# Patient Record
Sex: Female | Born: 1985 | Race: Black or African American | Hispanic: No | Marital: Single | Smoking: Current some day smoker
Health system: Southern US, Community
[De-identification: ages and names within clinical notes are randomized; demographics above are authoritative.]

## PROBLEM LIST (undated history)

## (undated) ENCOUNTER — Emergency Department (HOSPITAL_COMMUNITY): Admission: EM | Payer: Self-pay | Source: Home / Self Care

## (undated) DIAGNOSIS — F32A Depression, unspecified: Secondary | ICD-10-CM

## (undated) DIAGNOSIS — N390 Urinary tract infection, site not specified: Secondary | ICD-10-CM

## (undated) DIAGNOSIS — Z9889 Other specified postprocedural states: Secondary | ICD-10-CM

## (undated) DIAGNOSIS — A599 Trichomoniasis, unspecified: Secondary | ICD-10-CM

## (undated) DIAGNOSIS — A749 Chlamydial infection, unspecified: Secondary | ICD-10-CM

## (undated) DIAGNOSIS — R112 Nausea with vomiting, unspecified: Secondary | ICD-10-CM

## (undated) DIAGNOSIS — F329 Major depressive disorder, single episode, unspecified: Secondary | ICD-10-CM

## (undated) HISTORY — DX: Other specified postprocedural states: Z98.890

## (undated) HISTORY — DX: Other specified postprocedural states: R11.2

## (undated) HISTORY — DX: Major depressive disorder, single episode, unspecified: F32.9

## (undated) HISTORY — DX: Depression, unspecified: F32.A

---
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
## 2008-06-13 ENCOUNTER — Emergency Department (HOSPITAL_COMMUNITY): Admission: EM | Admit: 2008-06-13 | Discharge: 2008-06-13 | Payer: Self-pay | Admitting: Emergency Medicine

## 2008-06-13 ENCOUNTER — Inpatient Hospital Stay (HOSPITAL_COMMUNITY): Admission: AD | Admit: 2008-06-13 | Discharge: 2008-06-13 | Payer: Self-pay | Admitting: Obstetrics & Gynecology

## 2008-06-13 ENCOUNTER — Ambulatory Visit: Payer: Self-pay | Admitting: Obstetrics and Gynecology

## 2008-07-09 ENCOUNTER — Emergency Department (HOSPITAL_COMMUNITY): Admission: EM | Admit: 2008-07-09 | Discharge: 2008-07-09 | Payer: Self-pay | Admitting: Emergency Medicine

## 2009-01-29 ENCOUNTER — Inpatient Hospital Stay (HOSPITAL_COMMUNITY): Admission: AD | Admit: 2009-01-29 | Discharge: 2009-01-29 | Payer: Self-pay | Admitting: Obstetrics & Gynecology

## 2009-02-26 ENCOUNTER — Inpatient Hospital Stay (HOSPITAL_COMMUNITY): Admission: AD | Admit: 2009-02-26 | Discharge: 2009-02-26 | Payer: Self-pay | Admitting: Obstetrics and Gynecology

## 2009-03-27 ENCOUNTER — Ambulatory Visit: Payer: Self-pay | Admitting: Advanced Practice Midwife

## 2009-03-27 ENCOUNTER — Inpatient Hospital Stay (HOSPITAL_COMMUNITY): Admission: AD | Admit: 2009-03-27 | Discharge: 2009-03-27 | Payer: Self-pay | Admitting: Family Medicine

## 2009-05-15 ENCOUNTER — Emergency Department (HOSPITAL_COMMUNITY): Admission: EM | Admit: 2009-05-15 | Discharge: 2009-05-15 | Payer: Self-pay | Admitting: Family Medicine

## 2009-05-18 ENCOUNTER — Ambulatory Visit: Payer: Self-pay | Admitting: Obstetrics and Gynecology

## 2009-05-18 ENCOUNTER — Inpatient Hospital Stay (HOSPITAL_COMMUNITY): Admission: AD | Admit: 2009-05-18 | Discharge: 2009-05-19 | Payer: Self-pay | Admitting: Obstetrics and Gynecology

## 2009-06-09 ENCOUNTER — Inpatient Hospital Stay (HOSPITAL_COMMUNITY): Admission: AD | Admit: 2009-06-09 | Discharge: 2009-06-10 | Payer: Self-pay | Admitting: Obstetrics and Gynecology

## 2009-06-09 ENCOUNTER — Other Ambulatory Visit: Payer: Self-pay | Admitting: Emergency Medicine

## 2009-06-09 ENCOUNTER — Ambulatory Visit: Payer: Self-pay | Admitting: Obstetrics and Gynecology

## 2009-07-07 ENCOUNTER — Ambulatory Visit: Payer: Self-pay | Admitting: Advanced Practice Midwife

## 2009-07-07 ENCOUNTER — Inpatient Hospital Stay (HOSPITAL_COMMUNITY): Admission: AD | Admit: 2009-07-07 | Discharge: 2009-07-07 | Payer: Self-pay | Admitting: Family Medicine

## 2009-08-08 ENCOUNTER — Ambulatory Visit: Payer: Self-pay | Admitting: Family Medicine

## 2010-02-26 ENCOUNTER — Inpatient Hospital Stay (HOSPITAL_COMMUNITY): Admission: AD | Admit: 2010-02-26 | Discharge: 2009-08-04 | Payer: Self-pay | Admitting: Obstetrics & Gynecology

## 2010-02-26 ENCOUNTER — Inpatient Hospital Stay (HOSPITAL_COMMUNITY): Admission: AD | Admit: 2010-02-26 | Discharge: 2009-08-10 | Payer: Self-pay | Admitting: Family Medicine

## 2010-03-22 NOTE — L&D Delivery Note (Signed)
Delivery Note At 4:00 PM a viable female was delivered via Vaginal, Spontaneous Delivery (Presentation: Right Occiput Anterior).  APGAR: 9, 9; weight .   Placenta status: Intact, Spontaneous.  Cord: 3 vessels with the following complications: None.   Meconium stained placenta sent to pathology.  Anesthesia: Epidural  Episiotomy: None Lacerations: None Suture Repair: n/a Est. Blood Loss (mL): 400  Mom to postpartum.  Baby to nursery-stable.  CRESENZO-DISHMAN,Yukie Bergeron 01/09/2011, 4:20 PM

## 2010-06-07 LAB — URINALYSIS, ROUTINE W REFLEX MICROSCOPIC
Bilirubin Urine: NEGATIVE
Hgb urine dipstick: NEGATIVE
Specific Gravity, Urine: 1.015 (ref 1.005–1.030)
Urobilinogen, UA: 1 mg/dL (ref 0.0–1.0)

## 2010-06-07 LAB — DIFFERENTIAL
Basophils Absolute: 0.1 10*3/uL (ref 0.0–0.1)
Lymphocytes Relative: 14 % (ref 12–46)
Neutro Abs: 9.1 10*3/uL — ABNORMAL HIGH (ref 1.7–7.7)

## 2010-06-07 LAB — RAPID URINE DRUG SCREEN, HOSP PERFORMED
Amphetamines: NOT DETECTED
Cocaine: NOT DETECTED
Opiates: NOT DETECTED
Tetrahydrocannabinol: POSITIVE — AB

## 2010-06-07 LAB — RPR: RPR Ser Ql: NONREACTIVE

## 2010-06-07 LAB — CBC
Hemoglobin: 11 g/dL — ABNORMAL LOW (ref 12.0–15.0)
Platelets: 196 10*3/uL (ref 150–400)
RDW: 12.9 % (ref 11.5–15.5)

## 2010-06-07 LAB — RUBELLA SCREEN: Rubella: 14.4 IU/mL — ABNORMAL HIGH

## 2010-06-07 LAB — HEPATITIS B SURFACE ANTIGEN: Hepatitis B Surface Ag: NEGATIVE

## 2010-06-08 ENCOUNTER — Other Ambulatory Visit (HOSPITAL_COMMUNITY)
Admission: RE | Admit: 2010-06-08 | Discharge: 2010-06-08 | Disposition: A | Discharge status: Home / Self Care | Payer: BC Managed Care – PPO | Source: Ambulatory Visit | Attending: Family Medicine | Admitting: Family Medicine

## 2010-06-08 ENCOUNTER — Other Ambulatory Visit: Payer: Self-pay | Admitting: Family Medicine

## 2010-06-08 DIAGNOSIS — Z124 Encounter for screening for malignant neoplasm of cervix: Secondary | ICD-10-CM | POA: Insufficient documentation

## 2010-06-08 LAB — RPR: RPR Ser Ql: NONREACTIVE

## 2010-06-08 LAB — CBC
MCV: 91.5 fL (ref 78.0–100.0)
Platelets: 150 10*3/uL (ref 150–400)
RDW: 13.4 % (ref 11.5–15.5)
WBC: 10.4 10*3/uL (ref 4.0–10.5)

## 2010-06-08 LAB — URINE CULTURE: Colony Count: >=100000

## 2010-06-08 LAB — URINALYSIS, ROUTINE W REFLEX MICROSCOPIC
Bilirubin Urine: NEGATIVE
Hgb urine dipstick: NEGATIVE
Nitrite: POSITIVE — AB
Specific Gravity, Urine: 1.02 (ref 1.005–1.030)
pH: 7 (ref 5.0–8.0)

## 2010-06-08 LAB — RAPID URINE DRUG SCREEN, HOSP PERFORMED
Barbiturates: NOT DETECTED
Opiates: NOT DETECTED
Tetrahydrocannabinol: NOT DETECTED

## 2010-06-08 LAB — URINE MICROSCOPIC-ADD ON: RBC / HPF: NONE SEEN RBC/hpf (ref <3)

## 2010-06-09 LAB — WET PREP, GENITAL: Clue Cells Wet Prep HPF POC: NONE SEEN

## 2010-06-10 LAB — URINE MICROSCOPIC-ADD ON

## 2010-06-10 LAB — URINALYSIS, ROUTINE W REFLEX MICROSCOPIC
Glucose, UA: NEGATIVE mg/dL
Ketones, ur: NEGATIVE mg/dL
Specific Gravity, Urine: 1.025 (ref 1.005–1.030)
pH: 6 (ref 5.0–8.0)

## 2010-06-10 LAB — CBC
HCT: 31.9 % — ABNORMAL LOW (ref 36.0–46.0)
Hemoglobin: 10.4 g/dL — ABNORMAL LOW (ref 12.0–15.0)
MCV: 91.8 fL (ref 78.0–100.0)
RBC: 3.47 MIL/uL — ABNORMAL LOW (ref 3.87–5.11)
WBC: 12.1 10*3/uL — ABNORMAL HIGH (ref 4.0–10.5)

## 2010-06-10 LAB — URINE CULTURE

## 2010-06-14 LAB — COMPREHENSIVE METABOLIC PANEL WITH GFR
ALT: 10 U/L (ref 0–35)
AST: 12 U/L (ref 0–37)
Albumin: 3 g/dL — ABNORMAL LOW (ref 3.5–5.2)
Alkaline Phosphatase: 55 U/L (ref 39–117)
BUN: 7 mg/dL (ref 6–23)
CO2: 22 meq/L (ref 19–32)
Calcium: 8.3 mg/dL — ABNORMAL LOW (ref 8.4–10.5)
Chloride: 104 meq/L (ref 96–112)
Creatinine, Ser: 0.41 mg/dL (ref 0.4–1.2)
GFR calc non Af Amer: >60 mL/min
Glucose, Bld: 68 mg/dL — ABNORMAL LOW (ref 70–99)
Potassium: 3.4 meq/L — ABNORMAL LOW (ref 3.5–5.1)
Sodium: 133 meq/L — ABNORMAL LOW (ref 135–145)
Total Bilirubin: 0.2 mg/dL — ABNORMAL LOW (ref 0.3–1.2)
Total Protein: 6 g/dL (ref 6.0–8.3)

## 2010-06-14 LAB — URINE MICROSCOPIC-ADD ON

## 2010-06-14 LAB — CBC
HCT: 32.2 % — ABNORMAL LOW (ref 36.0–46.0)
Hemoglobin: 10.8 g/dL — ABNORMAL LOW (ref 12.0–15.0)
MCHC: 33.5 g/dL (ref 30.0–36.0)
MCV: 92.7 fL (ref 78.0–100.0)
Platelets: 134 10*3/uL — ABNORMAL LOW (ref 150–400)
RBC: 3.47 MIL/uL — ABNORMAL LOW (ref 3.87–5.11)
RDW: 13 % (ref 11.5–15.5)
WBC: 9.7 10*3/uL (ref 4.0–10.5)

## 2010-06-14 LAB — URINALYSIS, ROUTINE W REFLEX MICROSCOPIC
Glucose, UA: NEGATIVE mg/dL
Protein, ur: NEGATIVE mg/dL

## 2010-06-14 LAB — TSH: TSH: 0.996 u[IU]/mL (ref 0.350–4.500)

## 2010-06-22 ENCOUNTER — Inpatient Hospital Stay (HOSPITAL_COMMUNITY)
Admission: AD | Admit: 2010-06-22 | Discharge: 2010-06-22 | Disposition: A | Discharge status: Home / Self Care | Payer: Self-pay | Source: Ambulatory Visit | Attending: Obstetrics & Gynecology | Admitting: Obstetrics & Gynecology

## 2010-06-22 DIAGNOSIS — O2390 Unspecified genitourinary tract infection in pregnancy, unspecified trimester: Secondary | ICD-10-CM | POA: Insufficient documentation

## 2010-06-22 DIAGNOSIS — B9689 Other specified bacterial agents as the cause of diseases classified elsewhere: Secondary | ICD-10-CM | POA: Insufficient documentation

## 2010-06-22 DIAGNOSIS — A499 Bacterial infection, unspecified: Secondary | ICD-10-CM | POA: Insufficient documentation

## 2010-06-22 DIAGNOSIS — N760 Acute vaginitis: Secondary | ICD-10-CM | POA: Insufficient documentation

## 2010-06-22 DIAGNOSIS — O209 Hemorrhage in early pregnancy, unspecified: Secondary | ICD-10-CM | POA: Insufficient documentation

## 2010-06-22 LAB — URINALYSIS, ROUTINE W REFLEX MICROSCOPIC
Leukocytes, UA: NEGATIVE
Nitrite: POSITIVE — AB
Specific Gravity, Urine: 1.015 (ref 1.005–1.030)
pH: 8 (ref 5.0–8.0)

## 2010-06-22 LAB — WET PREP, GENITAL
Trich, Wet Prep: NONE SEEN
Yeast Wet Prep HPF POC: NONE SEEN

## 2010-06-22 LAB — CBC
MCHC: 33.4 g/dL (ref 30.0–36.0)
RDW: 12.9 % (ref 11.5–15.5)

## 2010-06-22 LAB — URINE MICROSCOPIC-ADD ON

## 2010-06-22 LAB — POCT PREGNANCY, URINE: Preg Test, Ur: POSITIVE

## 2010-06-23 LAB — URINALYSIS, ROUTINE W REFLEX MICROSCOPIC
Glucose, UA: NEGATIVE mg/dL
Nitrite: NEGATIVE
Protein, ur: NEGATIVE mg/dL
pH: 7 (ref 5.0–8.0)

## 2010-06-23 LAB — WET PREP, GENITAL
Trich, Wet Prep: NONE SEEN
Yeast Wet Prep HPF POC: NONE SEEN

## 2010-06-23 LAB — GC/CHLAMYDIA PROBE AMP, GENITAL
Chlamydia, DNA Probe: NEGATIVE
GC Probe Amp, Genital: NEGATIVE

## 2010-06-24 LAB — WET PREP, GENITAL
Trich, Wet Prep: NONE SEEN
Yeast Wet Prep HPF POC: NONE SEEN

## 2010-06-24 LAB — URINALYSIS, ROUTINE W REFLEX MICROSCOPIC
Bilirubin Urine: NEGATIVE
Glucose, UA: NEGATIVE mg/dL
Hgb urine dipstick: NEGATIVE
Ketones, ur: NEGATIVE mg/dL
Nitrite: NEGATIVE
Protein, ur: NEGATIVE mg/dL
Specific Gravity, Urine: 1.015 (ref 1.005–1.030)
Urobilinogen, UA: 0.2 mg/dL (ref 0.0–1.0)
pH: 8 (ref 5.0–8.0)

## 2010-06-24 LAB — CBC
HCT: 32.8 % — ABNORMAL LOW (ref 36.0–46.0)
Hemoglobin: 10.8 g/dL — ABNORMAL LOW (ref 12.0–15.0)
MCHC: 32.8 g/dL (ref 30.0–36.0)
MCV: 92 fL (ref 78.0–100.0)
Platelets: 203 10*3/uL (ref 150–400)
RBC: 3.56 MIL/uL — ABNORMAL LOW (ref 3.87–5.11)
RDW: 13.6 % (ref 11.5–15.5)
WBC: 12.3 10*3/uL — ABNORMAL HIGH (ref 4.0–10.5)

## 2010-06-24 LAB — GC/CHLAMYDIA PROBE AMP, GENITAL: Chlamydia, DNA Probe: NEGATIVE

## 2010-06-24 LAB — POCT PREGNANCY, URINE: Preg Test, Ur: POSITIVE

## 2010-07-01 LAB — POCT URINALYSIS DIP (DEVICE): Ketones, ur: NEGATIVE mg/dL

## 2010-07-01 LAB — URINE CULTURE: Colony Count: >=100000

## 2010-07-01 LAB — POCT PREGNANCY, URINE: Preg Test, Ur: NEGATIVE

## 2010-07-02 LAB — GC/CHLAMYDIA PROBE AMP, GENITAL: Chlamydia, DNA Probe: NEGATIVE

## 2010-07-02 LAB — ABO/RH: ABO/RH(D): A POS

## 2010-07-02 LAB — HCG, QUANTITATIVE, PREGNANCY: hCG, Beta Chain, Quant, S: 1359 m[IU]/mL — ABNORMAL HIGH (ref <5)

## 2010-08-07 NOTE — Op Note (Signed)
NAME:  Anna Gregory, Anna Gregory                         ACCOUNT NO.:  [id number]   MEDICAL RECORD NO.:  [id number]                   PATIENT TYPE:  INP   LOCATION:  9108                                 FACILITY:  [facility]   PHYSICIAN:  Phil D. Okey Dupre, M.D.                  DATE OF BIRTH:  June 17, 1985   DATE OF PROCEDURE:  03/16/2002  DATE OF DISCHARGE:                                 OPERATIVE REPORT   PROCEDURE:  Low forceps rotation delivery following failed vacuum delivery.   PREOPERATIVE DIAGNOSES:  1. Transverse arrest after two and a half hour second stage.  2. Maternal exhaustion.   POSTOPERATIVE DIAGNOSES:  1. Transverse arrest after two and a half hour second stage.  2. Maternal exhaustion.   SURGEON:  Javier Glazier. Okey Dupre, M.D.   PROCEDURE:  Under satisfactory epidural anesthesia with the patient in a  dorsal lithotomy position, full dilatation with __________  presentation and  ROT presentation.  Attempt to deliver when the patient pushed with KIWI  vacuum suction was not successful.  Luikhart McLean forceps were then placed  on the vertex in an attempt to turn the baby's head to an ROA presentation.  This could not be done so the forceps were reapplied and the vertex easily  turned to an ROP presentation and then easily delivered.  A midline  episiotomy was made, however, because of the shape of the head it extended  into a fourth degree laceration.  The cord was doubly clamped, divided, and  the baby into the nursery.  Cord blood was obtained and the placenta was  spontaneously removed, the uterus explored, as well as exploration of the  vagina and cervix.  There were no lateral lacerations or cervical  lacerations.  The fourth degree laceration was repaired with 2-0 chromic  suture in a two layer closure of the rectum, a running type of suture.  A  third layer of interrupteds were placed above the aforementioned to  reinforce the posterior vaginal wall.  Allis clamps were used to grasp  the  anal sphincter and the apex of the vagina, the vaginal tear was closed with  a continuous running locked 2-0 chromic on an atraumatic needle down toward  the mucocutaneous junction which was then brought through to the perineum.  Crown sutures of 2-0 chromic were used in the fourchette of the vagina and  this was run down to plicate the perineal muscles in the midline.  At this  point 2-0 chromic was used to reconnect the anal sphincter in the midline.  A second suture was placed above the first to reinforce this closure.  The  suture that was used to plicate the muscles in the midline were then used  for subcutaneous closure over the sphincter.  This was continued down  through the skin and the skin edge closure up to the fourchette of the  vagina with loose  sutures.  The vagina was then explored for any sponges  that may have been left and found to be clean.  Total blood loss during the  procedure was 700 cc.  Anesthesia was epidural.                                               Phil D. Okey Dupre, M.D.    PDR/MEDQ  D:  03/16/2002  T:  03/16/2002  Job:  [id number]

## 2010-08-18 ENCOUNTER — Emergency Department (HOSPITAL_COMMUNITY)
Admission: EM | Admit: 2010-08-18 | Discharge: 2010-08-19 | Disposition: A | Discharge status: Home / Self Care | Payer: Self-pay | Attending: Emergency Medicine | Admitting: Emergency Medicine

## 2010-08-18 DIAGNOSIS — L0501 Pilonidal cyst with abscess: Secondary | ICD-10-CM | POA: Insufficient documentation

## 2010-08-18 DIAGNOSIS — M549 Dorsalgia, unspecified: Secondary | ICD-10-CM | POA: Insufficient documentation

## 2010-08-18 DIAGNOSIS — J45909 Unspecified asthma, uncomplicated: Secondary | ICD-10-CM | POA: Insufficient documentation

## 2010-09-14 ENCOUNTER — Inpatient Hospital Stay (HOSPITAL_COMMUNITY)
Admission: AD | Admit: 2010-09-14 | Discharge: 2010-09-17 | DRG: 781 | Disposition: A | Discharge status: Home / Self Care | Payer: Medicaid Other | Source: Ambulatory Visit | Attending: Obstetrics & Gynecology | Admitting: Obstetrics & Gynecology

## 2010-09-14 ENCOUNTER — Inpatient Hospital Stay (HOSPITAL_COMMUNITY): Payer: Medicaid Other

## 2010-09-14 DIAGNOSIS — M549 Dorsalgia, unspecified: Secondary | ICD-10-CM

## 2010-09-14 DIAGNOSIS — N10 Acute tubulo-interstitial nephritis: Secondary | ICD-10-CM

## 2010-09-14 DIAGNOSIS — O2390 Unspecified genitourinary tract infection in pregnancy, unspecified trimester: Principal | ICD-10-CM | POA: Diagnosis present

## 2010-09-14 LAB — COMPREHENSIVE METABOLIC PANEL
AST: 10 U/L (ref 0–37)
Alkaline Phosphatase: 41 U/L (ref 39–117)
BUN: 6 mg/dL (ref 6–23)
CO2: 22 mEq/L (ref 19–32)
Chloride: 100 mEq/L (ref 96–112)
Creatinine, Ser: <0.47 mg/dL — ABNORMAL LOW (ref 0.50–1.10)
Potassium: 3.4 mEq/L — ABNORMAL LOW (ref 3.5–5.1)
Total Bilirubin: 0.4 mg/dL (ref 0.3–1.2)

## 2010-09-14 LAB — URINALYSIS, ROUTINE W REFLEX MICROSCOPIC
Bilirubin Urine: NEGATIVE
Ketones, ur: >80 mg/dL — AB
Nitrite: POSITIVE — AB
Protein, ur: NEGATIVE mg/dL
Specific Gravity, Urine: 1.025 (ref 1.005–1.030)
Urobilinogen, UA: 2 mg/dL — ABNORMAL HIGH (ref 0.0–1.0)

## 2010-09-14 LAB — CBC
HCT: 32.4 % — ABNORMAL LOW (ref 36.0–46.0)
MCV: 88.8 fL (ref 78.0–100.0)
RBC: 3.65 MIL/uL — ABNORMAL LOW (ref 3.87–5.11)
WBC: 12.5 10*3/uL — ABNORMAL HIGH (ref 4.0–10.5)

## 2010-09-14 LAB — WET PREP, GENITAL
Trich, Wet Prep: NONE SEEN
Yeast Wet Prep HPF POC: NONE SEEN

## 2010-09-15 ENCOUNTER — Inpatient Hospital Stay (HOSPITAL_COMMUNITY): Payer: Medicaid Other

## 2010-09-15 LAB — GC/CHLAMYDIA PROBE AMP, GENITAL: GC Probe Amp, Genital: NEGATIVE

## 2010-09-15 LAB — DIFFERENTIAL
Basophils Relative: 0 % (ref 0–1)
Lymphs Abs: 1.5 10*3/uL (ref 0.7–4.0)
Monocytes Absolute: 0.6 10*3/uL (ref 0.1–1.0)
Monocytes Relative: 7 % (ref 3–12)
Neutro Abs: 5.9 10*3/uL (ref 1.7–7.7)

## 2010-09-15 LAB — RUBELLA SCREEN: Rubella: 16.5 IU/mL — ABNORMAL HIGH

## 2010-09-15 LAB — CBC
Hemoglobin: 9.7 g/dL — ABNORMAL LOW (ref 12.0–15.0)
MCH: 29.3 pg (ref 26.0–34.0)
MCHC: 32.7 g/dL (ref 30.0–36.0)
MCV: 89.7 fL (ref 78.0–100.0)
RBC: 3.31 MIL/uL — ABNORMAL LOW (ref 3.87–5.11)

## 2010-09-15 LAB — HEPATITIS B SURFACE ANTIGEN: Hepatitis B Surface Ag: NEGATIVE

## 2010-09-20 LAB — URINE CULTURE: Culture  Setup Time: 201206260109

## 2010-09-21 ENCOUNTER — Inpatient Hospital Stay (HOSPITAL_COMMUNITY)
Admission: AD | Admit: 2010-09-21 | Discharge: 2010-09-22 | Disposition: A | Discharge status: Home / Self Care | Payer: Medicaid Other | Source: Ambulatory Visit | Attending: Obstetrics & Gynecology | Admitting: Obstetrics & Gynecology

## 2010-09-21 DIAGNOSIS — O4700 False labor before 37 completed weeks of gestation, unspecified trimester: Secondary | ICD-10-CM

## 2010-09-21 DIAGNOSIS — R109 Unspecified abdominal pain: Secondary | ICD-10-CM | POA: Insufficient documentation

## 2010-09-21 LAB — URINALYSIS, ROUTINE W REFLEX MICROSCOPIC
Bilirubin Urine: NEGATIVE
Glucose, UA: NEGATIVE mg/dL
Hgb urine dipstick: NEGATIVE
Ketones, ur: 40 mg/dL — AB
Nitrite: NEGATIVE
Specific Gravity, Urine: 1.02 (ref 1.005–1.030)
pH: 7 (ref 5.0–8.0)

## 2010-09-21 LAB — URINE MICROSCOPIC-ADD ON

## 2010-09-21 NOTE — Discharge Summary (Addendum)
  NAMEJOSALYNN, JOHNDROW               ACCOUNT NO.:  [id number]  MEDICAL RECORD NO.:  [id number]  LOCATION:  9159                          FACILITY:  [facility]  PHYSICIAN:  Tanya S. Shawnie Pons, M.D.   DATE OF BIRTH:  Feb 28, 1986  DATE OF ADMISSION:  09/14/2010 DATE OF DISCHARGE:  09/17/2010                              DISCHARGE SUMMARY   ADMISSION DIAGNOSES: 1. Intrauterine pregnancy at 21 weeks and 1 day number. 2. Acute pyelonephritis.  DISCHARGE DIAGNOSES: 1. Intrauterine pregnancy at 21 weeks and 3 days. 2. Acute pyelonephritis, resolved. 3. Musculoskeletal back pain.  ATTENDING PHYSICIAN:  Dr. Shawnie Pons, fellow is Dr. Orvan Falconer.  DISCHARGE MEDICATIONS: 1. Flexeril 5 mg 1 tablet p.o. t.i.d. as needed. 2. Prenatal vitamins 1 tablet p.o. daily.  HOSPITAL COURSE:  This is a 25 year old gravida 5, para 3-0-1-3 with intrauterine pregnancy at 21 weeks and 1 day presented with a presumptive diagnosis of pyelonephritis.  The patient was admitted and started on empiric antibiotics, white count on admission was 12.5.  She was started on Rocephin 1 g q.12.  She was afebrile.  CMP on admission was normal bur remarkable for slightly low sodium of 132 and potassium of 3.4 which was not repeated.  Creatinine was normal.  She also received an ultrasound, due to her lack of prenatal care, cervical length of 3.85 consistent with her dates.  Fetal anatomy appeared to be normal as per this ultrasound with variable presentation.  Placenta was noted to be anterior above the cervical os.  The patient continued to have some possible left CVA tenderness and paraspinal tenderness.  The patient's white count was repeated on the next day was 8.2.  Her urine culture subsequently returned negative.  Antibiotics were discontinued. The patient was started on Flexeril.  She noted remarkable improvement, and the patient was discharged home in stable condition.  DISPOSITION:  Discharged home.  DISCHARGE CONDITION:   Stable.  FOLLOWUP:  The patient is to follow up at the [facility] for her prenatal care.  She needs to call to make that appointment.  ER WARNINGS:  The patient should return to the emergency department with any fever, chills, nausea, vomiting, decreased fetal movement, contractions, bleeding, spotting, or any other concerning symptoms.    ______________________________ Maryelizabeth Kaufmann, MD   ______________________________ Shelbie Proctor. Shawnie Pons, M.D.    LC/MEDQ  D:  09/17/2010  T:  09/18/2010  Job:  [id number]  Electronically Signed by Tinnie Gens M.D. on 09/21/2010 36:64:40 PM Electronically Signed by Maryelizabeth Kaufmann MD on 09/29/2010 08:44:59 AM

## 2010-09-22 LAB — RAPID URINE DRUG SCREEN, HOSP PERFORMED
Barbiturates: NOT DETECTED
Benzodiazepines: NOT DETECTED
Cocaine: NOT DETECTED

## 2010-10-28 ENCOUNTER — Encounter (HOSPITAL_COMMUNITY): Payer: Self-pay | Admitting: *Deleted

## 2010-10-28 ENCOUNTER — Emergency Department (HOSPITAL_COMMUNITY)
Admission: EM | Admit: 2010-10-28 | Discharge: 2010-10-28 | Disposition: A | Discharge status: Other Acute Inpatient Hospital | Payer: Medicaid Other | Source: Home / Self Care | Attending: Emergency Medicine | Admitting: Emergency Medicine

## 2010-10-28 ENCOUNTER — Observation Stay (HOSPITAL_COMMUNITY)
Admission: AD | Admit: 2010-10-28 | Discharge: 2010-10-29 | Disposition: A | Discharge status: Home / Self Care | Payer: Medicaid Other | Source: Ambulatory Visit | Attending: Obstetrics and Gynecology | Admitting: Obstetrics and Gynecology

## 2010-10-28 DIAGNOSIS — W010XXA Fall on same level from slipping, tripping and stumbling without subsequent striking against object, initial encounter: Secondary | ICD-10-CM | POA: Insufficient documentation

## 2010-10-28 DIAGNOSIS — S3981XA Other specified injuries of abdomen, initial encounter: Secondary | ICD-10-CM

## 2010-10-28 DIAGNOSIS — R112 Nausea with vomiting, unspecified: Secondary | ICD-10-CM

## 2010-10-28 DIAGNOSIS — M549 Dorsalgia, unspecified: Secondary | ICD-10-CM | POA: Insufficient documentation

## 2010-10-28 DIAGNOSIS — R071 Chest pain on breathing: Secondary | ICD-10-CM | POA: Insufficient documentation

## 2010-10-28 DIAGNOSIS — R5381 Other malaise: Secondary | ICD-10-CM | POA: Insufficient documentation

## 2010-10-28 DIAGNOSIS — O2650 Maternal hypotension syndrome, unspecified trimester: Principal | ICD-10-CM | POA: Insufficient documentation

## 2010-10-28 DIAGNOSIS — Z3480 Encounter for supervision of other normal pregnancy, unspecified trimester: Secondary | ICD-10-CM

## 2010-10-28 DIAGNOSIS — R296 Repeated falls: Secondary | ICD-10-CM | POA: Insufficient documentation

## 2010-10-28 DIAGNOSIS — O0930 Supervision of pregnancy with insufficient antenatal care, unspecified trimester: Secondary | ICD-10-CM | POA: Insufficient documentation

## 2010-10-28 DIAGNOSIS — O99891 Other specified diseases and conditions complicating pregnancy: Secondary | ICD-10-CM | POA: Insufficient documentation

## 2010-10-28 DIAGNOSIS — Z3490 Encounter for supervision of normal pregnancy, unspecified, unspecified trimester: Secondary | ICD-10-CM

## 2010-10-28 DIAGNOSIS — R109 Unspecified abdominal pain: Secondary | ICD-10-CM | POA: Insufficient documentation

## 2010-10-28 DIAGNOSIS — N390 Urinary tract infection, site not specified: Secondary | ICD-10-CM

## 2010-10-28 DIAGNOSIS — R55 Syncope and collapse: Secondary | ICD-10-CM | POA: Insufficient documentation

## 2010-10-28 DIAGNOSIS — R5383 Other fatigue: Secondary | ICD-10-CM | POA: Insufficient documentation

## 2010-10-28 LAB — URINALYSIS, ROUTINE W REFLEX MICROSCOPIC
Glucose, UA: NEGATIVE mg/dL
Ketones, ur: >80 mg/dL — AB
Nitrite: NEGATIVE
Specific Gravity, Urine: 1.024 (ref 1.005–1.030)
pH: 7 (ref 5.0–8.0)

## 2010-10-28 LAB — URINE MICROSCOPIC-ADD ON

## 2010-10-28 LAB — COMPREHENSIVE METABOLIC PANEL
ALT: 7 U/L (ref 0–35)
Albumin: 3.3 g/dL — ABNORMAL LOW (ref 3.5–5.2)
Alkaline Phosphatase: 50 U/L (ref 39–117)
Potassium: 3.6 mEq/L (ref 3.5–5.1)
Sodium: 137 mEq/L (ref 135–145)
Total Protein: 6.8 g/dL (ref 6.0–8.3)

## 2010-10-28 LAB — DIFFERENTIAL
Basophils Relative: 1 % (ref 0–1)
Eosinophils Absolute: 0.3 10*3/uL (ref 0.0–0.7)
Eosinophils Relative: 3 % (ref 0–5)
Lymphs Abs: 2.2 10*3/uL (ref 0.7–4.0)
Monocytes Relative: 5 % (ref 3–12)
Neutrophils Relative %: 72 % (ref 43–77)

## 2010-10-28 LAB — POCT I-STAT TROPONIN I

## 2010-10-28 LAB — CBC
MCH: 29.5 pg (ref 26.0–34.0)
MCV: 87.2 fL (ref 78.0–100.0)
Platelets: 183 10*3/uL (ref 150–400)
RDW: 13.2 % (ref 11.5–15.5)

## 2010-10-28 MED ORDER — FAMOTIDINE 20 MG PO TABS
40.0000 mg | ORAL_TABLET | Freq: Two times a day (BID) | ORAL | Status: DC
  Administered 2010-10-28 – 2010-10-29 (×2): 40 mg via ORAL
  Filled 2010-10-28 (×3): qty 1

## 2010-10-28 MED ORDER — DEXTROSE IN LACTATED RINGERS 5 % IV SOLN
INTRAVENOUS | Status: AC
  Administered 2010-10-28: 21:00:00 via INTRAVENOUS

## 2010-10-28 MED ORDER — CALCIUM CARBONATE ANTACID 500 MG PO CHEW: 2.0000 | CHEWABLE_TABLET | ORAL | Status: DC | PRN

## 2010-10-28 MED ORDER — ACETAMINOPHEN 325 MG PO TABS: 650.0000 mg | ORAL_TABLET | ORAL | Status: DC | PRN

## 2010-10-28 MED ORDER — DOCUSATE SODIUM 100 MG PO CAPS
100.0000 mg | ORAL_CAPSULE | Freq: Every day | ORAL | Status: DC
  Administered 2010-10-29: 100 mg via ORAL
  Filled 2010-10-28: qty 1

## 2010-10-28 MED ORDER — PROMETHAZINE HCL 25 MG PO TABS
25.0000 mg | ORAL_TABLET | Freq: Four times a day (QID) | ORAL | Status: DC | PRN
  Administered 2010-10-28 – 2010-10-29 (×2): 25 mg via ORAL
  Filled 2010-10-28 (×2): qty 1

## 2010-10-28 MED ORDER — PRENATAL PLUS 27-1 MG PO TABS
1.0000 | ORAL_TABLET | Freq: Every day | ORAL | Status: DC
  Administered 2010-10-29: 1 via ORAL
  Filled 2010-10-28: qty 1

## 2010-10-28 MED ORDER — ZOLPIDEM TARTRATE 10 MG PO TABS
10.0000 mg | ORAL_TABLET | Freq: Every evening | ORAL | Status: DC | PRN
  Administered 2010-10-28: 10 mg via ORAL
  Filled 2010-10-28: qty 1

## 2010-10-28 MED ORDER — DEXTROSE IN LACTATED RINGERS 5 % IV SOLN
INTRAVENOUS | Status: DC
  Administered 2010-10-29 (×2): via INTRAVENOUS

## 2010-10-28 NOTE — Progress Notes (Signed)
RN to the bedside to locate FHR for continuous fetal tracing.

## 2010-10-28 NOTE — H&P (Signed)
Anna Gregory is a 25 y.o. female [phone] with IUP at [redacted]w[redacted]d (EDD: 11/05 by 09/14/10 [place], but LMP on 04/16/10) presenting for fall after syncopal event. Pt was transferred from [facility] after syncopal episode that occured today around 1:30pm. She states that she started feeling nauseous and dizzy. She then passed out and does not remember anything until she was at the hospital around 2:15PM. She was found by family members who saw her down on her belly. She now reports some soreness and tenderness along her left abdomen and left back. Pt reports not tolerating anything by mouth since Monday night. She reports episodes of vomiting every day since then. She also complains of acid reflux. Last episode of vomiting was this morning. She reports having had a similar syncopal episode last year during her last pregnancy. She reports having some chest tightness that comes and goes for 2-60months. No increased shortness of breath.  Denies any vaginal bleeding, any loss of fluid. Reports not feeling the baby move as much in the last 24hrs, but states that she started feeling fetal movement once she came to hospital. Complains of chronic whitish discharge.  ED course at [facility]: She was put on fetal monitoring at [facility] and found to have some uterine irritability. A bed side [place] was done that identified fetal head and moving arms and legs. FHR was 136. Pt also complained of pleuritic chest pain. EKG was done and showed normal sinus, unchanged from a previous EKG in 2011. One set of troponins was done and was negative. Glucose:66  Prenatal History/Complications: No prenatal care. Admission for pyelonephritis on 09/14/10 Past Medical History: No past medical history on file.  Past Surgical History: No past surgical history on file.  Obstetrical History: OB History    Grav Para Term Preterm Abortions TAB SAB Ect Mult Living   6 4 4  0 1  1   4      Pt reports that this is her 7th pregnancy. Reports 1 TAB and 1  miscarriage.  #1: TAB #2: term NSVD #3: term NSVD #4: 2007: C-section, pt was surrogate mother #5: SAB #6: 2011: SVD #7: current pregnancy  Social History: History   Social History  . Marital Status: Single    Spouse Name: N/A    Number of Children: N/A  . Years of Education: N/A   Social History Main Topics  . Smoking status: Current Some Day Smoker -- 0.0 packs/day for 3 years  . Smokeless tobacco: Not on file  . Alcohol Use: No  . Drug Use: No  . Sexually Active: Not Currently   Other Topics Concern  . Not on file   Social History Narrative  . No narrative on file  Smoking: 3-4cigarettes per day EtOH: denies Drugs: denies  Family History: No family history on file.  Allergies: Allergies  Allergen Reactions  . Aspirin Hives    Prescriptions prior to admission  Medication Sig Dispense Refill  . Prenatal Vit-Fe Fumarate-FA (PRENATAL MULTIVITAMIN) 60-1 MG tablet Take 1 tablet by mouth daily.          Review of Systems - Negative except per HPI   Blood pressure 106/70, pulse 84, temperature 97.9 F (36.6 C), temperature source Oral, resp. rate 20, height 5\' 8"  (1.727 m), weight 186 lb (84.369 kg), last menstrual period 04/16/2010, SpO2 100.00%, unknown if currently breastfeeding. General appearance: alert, cooperative and no distress Back: tenderness along the left paraspinal muscles Lungs: clear to auscultation bilaterally Heart: regular rate and rhythm, S1,  S2 normal, no murmur, click, rub or gallop Abdomen: gravid, tender in left lower and mid abdomen. No hematoma or erythema noted Extremities: extremities normal, atraumatic, no cyanosis or edema Baseline: 145 bpm, Variability: Good {> 6 bpm), Accelerations: Reactive and Decelerations: Variable: mild Uterine irritability     Prenatal labs: ABO, Rh: A POS (04/02 1130) Antibody:   Rubella:   RPR: NON REACTIVE (06/26 0515)  HBsAg: NEGATIVE (06/26 0515)  HIV:    GBS:    1 hr Glucola Genetic  screening  Anatomy US  [place] from 06/25: small subacute marginal placental bleed. No placental abruption or previa identified. UA: sp gr: 1024, pH:7.0, neg glucose, small bili, >80 ketones, neg protein, neg nitrites, large leuks, few yeast, 21-50 WBC, many squamous epi, many bacteria CMP: Na: 137, K: 3.6, Cl: 103, HCO3: 25, BUN: 7, Cr: 0.47, glucose: 66, AST: 10, AT: 7, AlkPh: 50, Protein: 6.8 CBC: Hemoglobin,: 10.8, Hematocrit: 31.9, platelets: 183, WBC: 11.0 Assessment: Anna Gregory is a 25 y.o. [id number] with an IUP at [redacted]w[redacted]d presenting for evaluation for fall after syncopal episode. Syncopal episode most likely due to hypoglycemia. Cardiac causes less likely.   Plan: 1. Admit for 23hour observation 2. IV bolus D5LR followed by maintenance at 125cc/hr 3. Nausea: phenergan 25mg  and pepcid 40mg   4. Fetal well being: [place] for AFI and placenta. Check Kleihauer-Betke 5. Check UDS Marena Chancy 10/28/2010, 10:33 PM

## 2010-10-28 NOTE — Progress Notes (Signed)
FHR - 146 bpm; maternal pulse - 80's.

## 2010-10-29 ENCOUNTER — Inpatient Hospital Stay (HOSPITAL_COMMUNITY): Payer: Medicaid Other

## 2010-10-29 DIAGNOSIS — S3981XA Other specified injuries of abdomen, initial encounter: Secondary | ICD-10-CM

## 2010-10-29 DIAGNOSIS — Z3480 Encounter for supervision of other normal pregnancy, unspecified trimester: Secondary | ICD-10-CM

## 2010-10-29 LAB — RAPID URINE DRUG SCREEN, HOSP PERFORMED
Barbiturates: NOT DETECTED
Tetrahydrocannabinol: POSITIVE — AB

## 2010-10-29 MED ORDER — CEPHALEXIN 500 MG PO CAPS: 500.0000 mg | ORAL_CAPSULE | Freq: Three times a day (TID) | ORAL | Status: AC

## 2010-10-29 MED ORDER — CEPHALEXIN 500 MG PO CAPS
500.0000 mg | ORAL_CAPSULE | Freq: Three times a day (TID) | ORAL | Status: DC
  Administered 2010-10-29 (×3): 500 mg via ORAL
  Filled 2010-10-29 (×5): qty 1

## 2010-10-29 NOTE — Progress Notes (Signed)
FACULTY PRACTICE ANTEPARTUM(COMPREHENSIVE) NOTE  Anna Gregory is a 25 y.o. [id number] at [redacted]w[redacted]d who is admitted for s/p syncopal episode with abdominal trauma.  Length of Stay:  1  Days  Subjective: Patient is without any complaints Patient reports the fetal movement as active. Patient reports uterine contraction  activity as occasional cramping pain. Patient reports  vaginal bleeding as none. Patient describes fluid per vagina as None.  Vitals:  Blood pressure 102/60, pulse 90, temperature 97.7 F (36.5 C), temperature source Oral, resp. rate 16, height 5\' 8"  (1.727 m), weight 84.369 kg (186 lb), last menstrual period 04/16/2010, SpO2 100.00%, unknown if currently breastfeeding. Physical Examination:  General appearance - alert, well appearing, and in no distress Abdomen - soft, gravid, NT Extremities - peripheral pulses normal, no pedal edema, no clubbing or cyanosis   Fetal Monitoring:  Baseline: 130 bpm, Variability: Good {> 6 bpm), Accelerations: Non-reactive but appropriate for gestational age and Decelerations: Absent  Labs:  Recent Results (from the past 24 hour(s))  URINALYSIS, ROUTINE W REFLEX MICROSCOPIC   Collection Time   10/28/10  2:26 PM      Component Value Range   Color, Urine AMBER (*) YELLOW    Appearance CLOUDY (*) CLEAR    Specific Gravity, Urine 1.024  1.005 - 1.030    pH 7.0  5.0 - 8.0    Glucose, UA NEGATIVE  NEGATIVE (mg/dL)   Hgb urine dipstick NEGATIVE  NEGATIVE    Bilirubin Urine SMALL (*) NEGATIVE    Ketones, ur >80 (*) NEGATIVE (mg/dL)   Protein, ur NEGATIVE  NEGATIVE (mg/dL)   Urobilinogen, UA 1.0  0.0 - 1.0 (mg/dL)   Nitrite NEGATIVE  NEGATIVE    Leukocytes, UA LARGE (*) NEGATIVE   URINE MICROSCOPIC-ADD ON   Collection Time   10/28/10  2:26 PM      Component Value Range   Squamous Epithelial / LPF MANY (*) RARE    WBC, UA 21-50  <3 (WBC/hpf)   Bacteria, UA MANY (*) RARE    Urine-Other FEW YEAST    DIFFERENTIAL   Collection Time   10/28/10   3:19 PM      Component Value Range   Neutrophils Relative 72  43 - 77 (%)   Neutro Abs 7.9 (*) 1.7 - 7.7 (K/uL)   Lymphocytes Relative 20  12 - 46 (%)   Lymphs Abs 2.2  0.7 - 4.0 (K/uL)   Monocytes Relative 5  3 - 12 (%)   Monocytes Absolute 0.5  0.1 - 1.0 (K/uL)   Eosinophils Relative 3  0 - 5 (%)   Eosinophils Absolute 0.3  0.0 - 0.7 (K/uL)   Basophils Relative 1  0 - 1 (%)   Basophils Absolute 0.1  0.0 - 0.1 (K/uL)  CBC   Collection Time   10/28/10  3:19 PM      Component Value Range   WBC 11.0 (*) 4.0 - 10.5 (K/uL)   RBC 3.66 (*) 3.87 - 5.11 (MIL/uL)   Hemoglobin 10.8 (*) 12.0 - 15.0 (g/dL)   HCT 65.7 (*) 84.6 - 46.0 (%)   MCV 87.2  78.0 - 100.0 (fL)   MCH 29.5  26.0 - 34.0 (pg)   MCHC 33.9  30.0 - 36.0 (g/dL)   RDW 96.2  95.2 - 84.1 (%)   Platelets 183  150 - 400 (K/uL)  COMPREHENSIVE METABOLIC PANEL   Collection Time   10/28/10  3:19 PM      Component Value Range   Sodium  137  135 - 145 (mEq/L)   Potassium 3.6  3.5 - 5.1 (mEq/L)   Chloride 103  96 - 112 (mEq/L)   CO2 25  19 - 32 (mEq/L)   Glucose, Bld 66 (*) 70 - 99 (mg/dL)   BUN 7  6 - 23 (mg/dL)   Creatinine, Ser <1.61 (*) 0.50 - 1.10 (mg/dL)   Calcium 9.0  8.4 - 09.6 (mg/dL)   Total Protein 6.8  6.0 - 8.3 (g/dL)   Albumin 3.3 (*) 3.5 - 5.2 (g/dL)   AST 10  0 - 37 (U/L)   ALT 7  0 - 35 (U/L)   Alkaline Phosphatase 50  39 - 117 (U/L)   Total Bilirubin 0.3  0.3 - 1.2 (mg/dL)   GFR calc non Af Amer NOT CALCULATED  >60 (mL/min)   GFR calc Af Amer NOT CALCULATED  >60 (mL/min)  POCT I-STAT TROPONIN I   Collection Time   10/28/10  3:52 PM      Component Value Range   Troponin i, poc 0.00  0.00 - 0.08 (ng/mL)   Comment 3           KLEIHAUER-BETKE STAIN   Collection Time   10/28/10 10:25 PM      Component Value Range   Fetal Cells % 0     Quantitation Fetal Hemoglobin 0      Imaging Studies:   Currently EPIC will not allow sonographic studies to automatically populate into notes.  In the meantime, copy and paste  results into note or free text.  Medications:  Scheduled    . cephALEXin  500 mg Oral Q8H  . docusate sodium  100 mg Oral Daily  . famotidine  40 mg Oral BID  . prenatal vitamin w/FE, FA  1 tablet Oral Daily   I have reviewed the patient's current medications.  ASSESSMENT: 25 yo [id number] s/p syncopal episode with abdominal trauma and negative work-up thus far  PLAN: - Continue monitoring and routine antenatal care - awaiting UDS - will order 1hr GCT  Owynn Mosqueda 10/29/2010,7:06 AM

## 2010-10-29 NOTE — Discharge Summary (Signed)
Physician Discharge Summary  Patient ID: Anna Gregory MRN: [id number] DOB/AGE: 1985-09-21 25 y.o.  Admit date: 10/28/2010 Discharge date: 10/29/2010  Admission Diagnoses: Supervision of normal pregnancy Abdominal trauma  Discharge Diagnoses:  Active Problems:  Supervision of other normal pregnancy  Blunt trauma of abdominal wall   Discharged Condition: stable  Hospital Course: Patient is a 40JW [id number] who presented at 25.0wks with syncopal episode and possible abdominal trauma.  She was initially monitored for 4 hours post trauma and had some uterine irritability. She was thus admitted for 23hr observation.  Patient's irritability subsided with IV hydration and good PO intake.  At time of discharge, she was not having any irritability or contractions on toco.  The fetal monitoring showed good variability without decels.  Patient denied any pain or vaginal bleeding.  She was discharged home in stable condition with plans to follow up at the health department.  Initial UA was consistent with signs of infection so patient was started on keflex empirically.  Cultures were pending at time of discharge  Patient had a UDS which was positive for [facility], when discuss with patient, she denied any marijuana use.  The risks of drug use were discussed with the patient and she verbalized understanding.  Consults: none  Significant Diagnostic Studies: labs: UDS + for THC;  Treatments: IV hydration  Discharge Exam: Blood pressure 99/50, pulse 78, temperature 98.1 F (36.7 C), temperature source Oral, resp. rate 18, height 5\' 8"  (1.727 m), weight 186 lb (84.369 kg), last menstrual period 04/16/2010, SpO2 100.00%, unknown if currently breastfeeding. General appearance: alert and no distress Head: Normocephalic, without obvious abnormality, atraumatic Resp: clear to auscultation bilaterally GI: soft, gravid, non tender  Disposition: Home or Self Care  Discharge Orders    Future Orders Please  Complete By Expires   Diet general      Increase activity slowly      Call MD for:  temperature >100.4      Call MD for:  persistant nausea and vomiting      Call MD for:  severe uncontrolled pain      Call MD for:      Comments:   Vaginal bleeding, contractions, decreased fetal movement     Current Discharge Medication List    CONTINUE these medications which have NOT CHANGED   Details  prenatal vitamin w/FE, FA (PRENATAL 1 + 1) 27-1 MG TABS Take 1 tablet by mouth daily.        STOP taking these medications     Prenatal Vit-Fe Fumarate-FA (PRENATAL MULTIVITAMIN) 60-1 MG tablet          Signed: Lindaann Slough MD  This case has been discussed with Dr Debroah Loop who is in agreement with this plan 10/29/2010, 2:52 PM

## 2010-10-29 NOTE — H&P (Signed)
Agree with above note. A KB was also ordered  [name] 10/29/2010 7:04 AM

## 2010-10-29 NOTE — Progress Notes (Signed)
UR Chart review completed.  

## 2010-10-30 NOTE — Discharge Summary (Signed)
I agree with the above.  Dorathy Kinsman 10/30/10 3:54 PM

## 2010-10-31 LAB — URINE CULTURE
Culture  Setup Time: 201208100151
Special Requests: NORMAL

## 2010-12-16 ENCOUNTER — Other Ambulatory Visit: Payer: Self-pay | Admitting: Obstetrics and Gynecology

## 2010-12-16 ENCOUNTER — Ambulatory Visit (INDEPENDENT_AMBULATORY_CARE_PROVIDER_SITE_OTHER): Payer: Medicaid Other | Admitting: Family Medicine

## 2010-12-16 ENCOUNTER — Other Ambulatory Visit: Payer: Self-pay | Admitting: Family Medicine

## 2010-12-16 VITALS — BP 99/60 | Temp 98.5°F | Wt 193.6 lb

## 2010-12-16 DIAGNOSIS — Z3480 Encounter for supervision of other normal pregnancy, unspecified trimester: Secondary | ICD-10-CM

## 2010-12-16 DIAGNOSIS — R3989 Other symptoms and signs involving the genitourinary system: Secondary | ICD-10-CM

## 2010-12-16 LAB — POCT URINALYSIS DIP (DEVICE)
Glucose, UA: NEGATIVE mg/dL
Hgb urine dipstick: NEGATIVE
Nitrite: NEGATIVE
pH: 7 (ref 5.0–8.0)

## 2010-12-16 MED ORDER — CEPHALEXIN 500 MG PO CAPS: 500.0000 mg | ORAL_CAPSULE | Freq: Four times a day (QID) | ORAL | Status: AC

## 2010-12-16 NOTE — Progress Notes (Signed)
P=81, c/o trace edema in feet, c/o pelvic pain and pressure, given new patient pregnancy booklet and cord blood info

## 2010-12-16 NOTE — Progress Notes (Signed)
Patient was seen and reviewed with PA-S Joseph Berkshire. Agree with documentation.

## 2010-12-16 NOTE — Patient Instructions (Signed)
SEEK IMMEDIATE MEDICAL CARE IF:  Your develop contractions that  continue to become stronger, more regular, and closer together.   You have a gushing, burst or leaking of fluid from the vagina.   An oral temperature above 100.4 develops.   You have passage of blood-tinged mucus.   You develop vaginal bleeding.   You develop continuous belly (abdominal) pain.   You have low back pain that you never had before.   You feel the baby's head pushing down causing pelvic pressure.   The baby is not moving as much as it used to.  Document Released: 03/08/2005 Document Re-Released: 08/26/2009 [facility] Patient Information 2011 [place], [place].

## 2010-12-16 NOTE — Progress Notes (Signed)
Subjective:    Anna Gregory is a 25 y.o. female being seen today for her obstetrical visit. She is at [redacted]w[redacted]d gestation. Patient reports backache, no bleeding, no contractions, no leaking, vaginal irritation and white discharge with vaginal itching. Fetal movement: normal.  Menstrual History: OB History    Grav Para Term Preterm Abortions TAB SAB Ect Mult Living   7 4 4  0 2 1 1   4        Objective:    BP 99/60  Temp 98.5 F (36.9 C)  Wt 193 lb 9.6 oz (87.816 kg)  LMP 04/16/2010  Breastfeeding? Unknown FHT:  128 BPM  Uterine Size: 35 cm  Presentation: unsure   Pelvic: Moderate amount of thin, white discharge noted. Cervix is pink and non-friable. Cervical os is closed.   Assessment:    Pregnancy 34 and 6/7 weeks   Plan:    28-week labs reviewed, normal Consent signed. VBAC form signed; BTL medicaid form signed Follow up in 2 Weeks.

## 2010-12-17 LAB — WET PREP, GENITAL: Clue Cells Wet Prep HPF POC: NONE SEEN

## 2010-12-17 LAB — GC/CHLAMYDIA PROBE AMP, GENITAL
Chlamydia, DNA Probe: NEGATIVE
GC Probe Amp, Genital: NEGATIVE

## 2010-12-18 LAB — CULTURE, OB URINE

## 2011-01-08 ENCOUNTER — Encounter: Payer: Self-pay | Admitting: Obstetrics & Gynecology

## 2011-01-09 ENCOUNTER — Encounter (HOSPITAL_COMMUNITY): Payer: Self-pay | Admitting: *Deleted

## 2011-01-09 ENCOUNTER — Inpatient Hospital Stay (HOSPITAL_COMMUNITY): Payer: Medicaid Other | Admitting: Anesthesiology

## 2011-01-09 ENCOUNTER — Inpatient Hospital Stay (HOSPITAL_COMMUNITY)
Admission: AD | Admit: 2011-01-09 | Discharge: 2011-01-11 | DRG: 767 | Disposition: A | Discharge status: Home / Self Care | Payer: Medicaid Other | Source: Ambulatory Visit | Attending: Obstetrics & Gynecology | Admitting: Obstetrics & Gynecology

## 2011-01-09 ENCOUNTER — Observation Stay (HOSPITAL_COMMUNITY)
Admission: AD | Admit: 2011-01-09 | Discharge: 2011-01-09 | Disposition: A | Discharge status: Home / Self Care | Payer: Medicaid Other | Source: Ambulatory Visit | Attending: Obstetrics & Gynecology | Admitting: Obstetrics & Gynecology

## 2011-01-09 ENCOUNTER — Encounter (HOSPITAL_COMMUNITY): Payer: Self-pay | Admitting: Anesthesiology

## 2011-01-09 DIAGNOSIS — IMO0001 Reserved for inherently not codable concepts without codable children: Secondary | ICD-10-CM

## 2011-01-09 DIAGNOSIS — Z302 Encounter for sterilization: Secondary | ICD-10-CM

## 2011-01-09 DIAGNOSIS — O34219 Maternal care for unspecified type scar from previous cesarean delivery: Secondary | ICD-10-CM

## 2011-01-09 DIAGNOSIS — O479 False labor, unspecified: Principal | ICD-10-CM

## 2011-01-09 DIAGNOSIS — Z3480 Encounter for supervision of other normal pregnancy, unspecified trimester: Secondary | ICD-10-CM

## 2011-01-09 LAB — RPR: RPR Ser Ql: NONREACTIVE

## 2011-01-09 LAB — CBC
HCT: 33.4 % — ABNORMAL LOW (ref 36.0–46.0)
Hemoglobin: 10.9 g/dL — ABNORMAL LOW (ref 12.0–15.0)
RBC: 3.73 MIL/uL — ABNORMAL LOW (ref 3.87–5.11)

## 2011-01-09 MED ORDER — SIMETHICONE 80 MG PO CHEW: 80.0000 mg | CHEWABLE_TABLET | ORAL | Status: DC | PRN

## 2011-01-09 MED ORDER — LACTATED RINGERS IV SOLN
INTRAVENOUS | Status: DC
  Administered 2011-01-09 (×2): via INTRAVENOUS

## 2011-01-09 MED ORDER — CITRIC ACID-SODIUM CITRATE 334-500 MG/5ML PO SOLN: 30.0000 mL | ORAL | Status: DC | PRN

## 2011-01-09 MED ORDER — PHENYLEPHRINE 40 MCG/ML (10ML) SYRINGE FOR IV PUSH (FOR BLOOD PRESSURE SUPPORT)
80.0000 ug | PREFILLED_SYRINGE | INTRAVENOUS | Status: DC | PRN
  Filled 2011-01-09: qty 5

## 2011-01-09 MED ORDER — OXYTOCIN 20 UNITS IN LACTATED RINGERS INFUSION - SIMPLE: 125.0000 mL/h | Freq: Once | INTRAVENOUS | Status: DC

## 2011-01-09 MED ORDER — DIPHENHYDRAMINE HCL 50 MG/ML IJ SOLN: 12.5000 mg | INTRAMUSCULAR | Status: DC | PRN

## 2011-01-09 MED ORDER — IBUPROFEN 600 MG PO TABS: 600.0000 mg | ORAL_TABLET | Freq: Four times a day (QID) | ORAL | Status: DC

## 2011-01-09 MED ORDER — NALBUPHINE SYRINGE 5 MG/0.5 ML
5.0000 mg | INJECTION | INTRAMUSCULAR | Status: DC | PRN
  Filled 2011-01-09: qty 0.5

## 2011-01-09 MED ORDER — FENTANYL 2.5 MCG/ML BUPIVACAINE 1/10 % EPIDURAL INFUSION (WH - ANES)
14.0000 mL/h | INTRAMUSCULAR | Status: DC
  Administered 2011-01-09 (×3): 14 mL/h via EPIDURAL
  Filled 2011-01-09 (×3): qty 60

## 2011-01-09 MED ORDER — ACETAMINOPHEN 325 MG PO TABS: 650.0000 mg | ORAL_TABLET | ORAL | Status: DC | PRN

## 2011-01-09 MED ORDER — ONDANSETRON HCL 4 MG/2ML IJ SOLN: 4.0000 mg | INTRAMUSCULAR | Status: DC | PRN

## 2011-01-09 MED ORDER — SENNOSIDES-DOCUSATE SODIUM 8.6-50 MG PO TABS
2.0000 | ORAL_TABLET | Freq: Every day | ORAL | Status: DC
  Administered 2011-01-09 – 2011-01-10 (×2): 2 via ORAL

## 2011-01-09 MED ORDER — EPHEDRINE 5 MG/ML INJ
10.0000 mg | INTRAVENOUS | Status: DC | PRN
  Filled 2011-01-09 (×2): qty 4

## 2011-01-09 MED ORDER — ZOLPIDEM TARTRATE 5 MG PO TABS: 5.0000 mg | ORAL_TABLET | Freq: Every evening | ORAL | Status: DC | PRN

## 2011-01-09 MED ORDER — ONDANSETRON HCL 4 MG/2ML IJ SOLN: 4.0000 mg | Freq: Four times a day (QID) | INTRAMUSCULAR | Status: DC | PRN

## 2011-01-09 MED ORDER — DIPHENHYDRAMINE HCL 25 MG PO CAPS: 25.0000 mg | ORAL_CAPSULE | Freq: Four times a day (QID) | ORAL | Status: DC | PRN

## 2011-01-09 MED ORDER — LANOLIN HYDROUS EX OINT: TOPICAL_OINTMENT | CUTANEOUS | Status: DC | PRN

## 2011-01-09 MED ORDER — DIBUCAINE 1 % RE OINT: 1.0000 "application " | TOPICAL_OINTMENT | RECTAL | Status: DC | PRN

## 2011-01-09 MED ORDER — TETANUS-DIPHTH-ACELL PERTUSSIS 5-2.5-18.5 LF-MCG/0.5 IM SUSP
0.5000 mL | Freq: Once | INTRAMUSCULAR | Status: AC
  Administered 2011-01-10: 0.5 mL via INTRAMUSCULAR
  Filled 2011-01-09: qty 0.5

## 2011-01-09 MED ORDER — EPHEDRINE 5 MG/ML INJ
10.0000 mg | INTRAVENOUS | Status: DC | PRN
  Filled 2011-01-09: qty 4

## 2011-01-09 MED ORDER — LACTATED RINGERS IV SOLN
500.0000 mL | Freq: Once | INTRAVENOUS | Status: AC
  Administered 2011-01-09: 11:00:00 via INTRAVENOUS

## 2011-01-09 MED ORDER — OXYTOCIN BOLUS FROM INFUSION
500.0000 mL | Freq: Once | INTRAVENOUS | Status: DC
  Filled 2011-01-09: qty 1000
  Filled 2011-01-09: qty 500

## 2011-01-09 MED ORDER — METOCLOPRAMIDE HCL 10 MG PO TABS
10.0000 mg | ORAL_TABLET | Freq: Once | ORAL | Status: DC | PRN
  Filled 2011-01-09: qty 1

## 2011-01-09 MED ORDER — FAMOTIDINE 20 MG PO TABS
40.0000 mg | ORAL_TABLET | Freq: Once | ORAL | Status: DC | PRN
  Filled 2011-01-09: qty 1

## 2011-01-09 MED ORDER — OXYCODONE-ACETAMINOPHEN 5-325 MG PO TABS
2.0000 | ORAL_TABLET | ORAL | Status: DC | PRN
  Administered 2011-01-10: 2 via ORAL
  Filled 2011-01-09: qty 2
  Filled 2011-01-09: qty 1

## 2011-01-09 MED ORDER — OXYCODONE-ACETAMINOPHEN 5-325 MG PO TABS
1.0000 | ORAL_TABLET | ORAL | Status: DC | PRN
  Administered 2011-01-09: 1 via ORAL
  Administered 2011-01-10 (×2): 2 via ORAL
  Administered 2011-01-11: 1 via ORAL
  Administered 2011-01-11 (×2): 2 via ORAL
  Filled 2011-01-09 (×3): qty 2
  Filled 2011-01-09 (×2): qty 1
  Filled 2011-01-09: qty 2

## 2011-01-09 MED ORDER — ONDANSETRON HCL 4 MG PO TABS: 4.0000 mg | ORAL_TABLET | ORAL | Status: DC | PRN

## 2011-01-09 MED ORDER — FLEET ENEMA 7-19 GM/118ML RE ENEM: 1.0000 | ENEMA | RECTAL | Status: DC | PRN

## 2011-01-09 MED ORDER — LIDOCAINE HCL (PF) 1 % IJ SOLN
30.0000 mL | INTRAMUSCULAR | Status: DC | PRN
  Filled 2011-01-09 (×2): qty 30

## 2011-01-09 MED ORDER — BENZOCAINE-MENTHOL 20-0.5 % EX AERO: 1.0000 "application " | INHALATION_SPRAY | CUTANEOUS | Status: DC | PRN

## 2011-01-09 MED ORDER — WITCH HAZEL-GLYCERIN EX PADS: 1.0000 "application " | MEDICATED_PAD | CUTANEOUS | Status: DC | PRN

## 2011-01-09 MED ORDER — LACTATED RINGERS IV SOLN
INTRAVENOUS | Status: DC
  Administered 2011-01-10: 12:00:00 via INTRAVENOUS

## 2011-01-09 MED ORDER — SODIUM BICARBONATE 8.4 % IV SOLN
INTRAVENOUS | Status: DC | PRN
  Administered 2011-01-09: 5 mL via EPIDURAL

## 2011-01-09 MED ORDER — LACTATED RINGERS IV SOLN
500.0000 mL | INTRAVENOUS | Status: DC | PRN
  Administered 2011-01-09: 500 mL via INTRAVENOUS

## 2011-01-09 MED ORDER — PHENYLEPHRINE 40 MCG/ML (10ML) SYRINGE FOR IV PUSH (FOR BLOOD PRESSURE SUPPORT)
80.0000 ug | PREFILLED_SYRINGE | INTRAVENOUS | Status: DC | PRN
  Filled 2011-01-09 (×2): qty 5

## 2011-01-09 MED ORDER — FENTANYL 2.5 MCG/ML BUPIVACAINE 1/10 % EPIDURAL INFUSION (WH - ANES)
INTRAMUSCULAR | Status: DC | PRN
  Administered 2011-01-09: 14 mL/h via EPIDURAL

## 2011-01-09 NOTE — Progress Notes (Signed)
anes called pt c/o pain 10/10 - 4 bolus given within hour - stated he would be there as soon as he could-  Currently in or

## 2011-01-09 NOTE — Anesthesia Procedure Notes (Signed)

## 2011-01-09 NOTE — Progress Notes (Signed)
Discharge instructions given to pt. Pt verbalized understanding. RN walked Pt and family out.

## 2011-01-09 NOTE — Progress Notes (Signed)
   Anna Gregory is a 25 y.o. [phone] at [redacted]w[redacted]d  admitted for active labor  Subjective: Comfortable with epidural   Objective: BP 108/78  Pulse 153  Temp(Src) 97.9 F (36.6 C) (Oral)  Resp 18  Ht 5\' 7"  (1.702 m)  Wt 87.635 kg (193 lb 3.2 oz)  BMI 30.26 kg/m2  SpO2 99%  LMP 04/16/2010  Breastfeeding? Unknown    FHT:  FHR: 130 bpm, variability: moderate,  accelerations:  Present,  decelerations:  Absent UC:   regular, every 3-4 minutes SVE:   Dilation: 5 Effacement (%): 100 Station: -2 Exam by:: fran crez-dishmon cnm  Labs: Lab Results  Component Value Date   WBC 13.8* 01/09/2011   HGB 10.9* 01/09/2011   HCT 33.4* 01/09/2011   MCV 89.5 01/09/2011   PLT 182 01/09/2011    Assessment / Plan: Protracted active phase; AROM with moderately meconium stained amniotic fluid  Labor: active labor, protracted Fetal Wellbeing:  Category I Pain Control:  Epidural Anticipated MOD:  NSVD  CRESENZO-DISHMAN,Jennise Both 01/09/2011, 2:14 PM

## 2011-01-09 NOTE — Anesthesia Preprocedure Evaluation (Signed)
Anesthesia Evaluation  Name, MR# and DOB Patient awake  General Assessment Comment  Reviewed: Allergy & Precautions, H&P , Patient's Chart, lab work & pertinent test results  Airway Mallampati: II TM Distance: >3 FB Neck ROM: full    Dental  (+) Teeth Intact   Pulmonary  clear to auscultation        Cardiovascular regular Normal    Neuro/Psych    GI/Hepatic   Endo/Other    Renal/GU      Musculoskeletal   Abdominal   Peds  Hematology   Anesthesia Other Findings       Reproductive/Obstetrics (+) Pregnancy                           Anesthesia Physical Anesthesia Plan  ASA: II  Anesthesia Plan: Epidural   Post-op Pain Management:    Induction:   Airway Management Planned:   Additional Equipment:   Intra-op Plan:   Post-operative Plan:   Informed Consent: I have reviewed the patients History and Physical, chart, labs and discussed the procedure including the risks, benefits and alternatives for the proposed anesthesia with the patient or authorized representative who has indicated his/her understanding and acceptance.   Dental Advisory Given  Plan Discussed with:   Anesthesia Plan Comments: (Labs checked- platelets confirmed with RN in room. Fetal heart tracing, per RN, reported to be stable enough for sitting procedure. Discussed epidural, and patient consents to the procedure:  included risk of possible headache,backache, failed block, allergic reaction, and nerve injury. This patient was asked if she had any questions or concerns before the procedure started. )        Anesthesia Quick Evaluation  

## 2011-01-09 NOTE — Progress Notes (Signed)
Contractions strong and closer together. Denies SROM or vag bleeding. Reports good fetal movement

## 2011-01-09 NOTE — H&P (Signed)
  Anna Gregory is a 25 y.o. female [phone] with IUP at [redacted]w[redacted]d presenting for labor. Pt states she has been having regular, every 2-3 minutes, associated with none vaginal bleeding, intact, with active.  She desires to bilateral tubal ligation.  [facility].  All labs/U/ Prenatal History/Complications:  Past Medical History: Past Medical History  Diagnosis Date  . PONV (postoperative nausea and vomiting)   . Depression     Past Surgical History: Past Surgical History  Procedure Date  . Cesarean section     Obstetrical History: OB History    Grav Para Term Preterm Abortions TAB SAB Ect Mult Living   7 4 4  0 2 1 1   4       Gynecological History: +trich/chl 2005 (treated) Hx Abn PAP  Social History: History   Social History  . Marital Status: Single    Spouse Name: N/A    Number of Children: N/A  . Years of Education: N/A   Social History Main Topics  . Smoking status: Current Some Day Smoker -- 0.2 packs/day for 3 years  . Smokeless tobacco: None  . Alcohol Use: No  . Drug Use: No  . Sexually Active: Not Currently   Other Topics Concern  . None   Social History Narrative  . None    Family History: History reviewed. No pertinent family history.  Allergies: Allergies  Allergen Reactions  . Strawberry Anaphylaxis and Other (See Comments)    Reaction: throat swelling   . Aspirin Hives    Prescriptions prior to admission  Medication Sig Dispense Refill  . prenatal vitamin w/FE, FA (PRENATAL 1 + 1) 27-1 MG TABS Take 1 tablet by mouth daily.          Review of Systems - Negative except for contractions   Blood pressure 115/68, pulse 74, temperature 97.2 F (36.2 C), temperature source Oral, resp. rate 18, height 5\' 7"  (1.702 m), weight 87.635 kg (193 lb 3.2 oz), last menstrual period 04/16/2010. General appearance: alert, cooperative and mild distress Lungs: clear to auscultation bilaterally Heart: regular rate and rhythm Abdomen:  soft, non-tender; bowel sounds normal Pelvic: 4-5/75/-2 Extremities: Homans sign is negative, no sign of DVT DTR's 2+ Presentation: cephalic Baseline: 140 bpm, Variability: Good {> 6 bpm), Accelerations: Reactive and Decelerations: Absent Frequency: Every 2-3 minutes CX now 4-5/75/-2/vtx   Prenatal labs: ABO, Rh: --/--/A POS (04/02 1130) Antibody:  immune   Rubella:  immune RPR: NON REACTIVE (06/26 0515)  HBsAg: NEGATIVE (06/26 0515)  HIV:   neg GBS:   neg 1 hr Glucola 131 Genetic screening  Too late Anatomy US normal   Assessment: Anna Gregory is a 25 y.o. [phone] with an IUP at [redacted]w[redacted]d presenting for active labaor  Plan: admit   CRESENZO-DISHMAN,Kiptyn Rafuse 01/09/2011, 10:13 AM

## 2011-01-09 NOTE — Progress Notes (Signed)
Pt states, " I've had contractions since last night, and tonight I've had two gushes. It was enough that I had to take a shower."

## 2011-01-09 NOTE — Discharge Summary (Signed)
Agree with above note.  Anna Gregory H. 01/09/2011 5:06 AM

## 2011-01-09 NOTE — Progress Notes (Signed)
  Chief Complaint:  Contractions and Rupture of Membranes   Anna Gregory is  25 y.o. [id number] at [redacted]w[redacted]d.  Patient's last menstrual period was 04/16/2010.Marland Kitchen  Her pregnancy status is positive.  She presents complaining of Contractions and Rupture of Membranes . Obstetrical/Gynecological History: OB History    Grav Para Term Preterm Abortions TAB SAB Ect Mult Living   7 5 5  0 2 1 1   5      Past Medical History: Past Medical History  Diagnosis Date  . PONV (postoperative nausea and vomiting)   . Depression     Past Surgical History: Past Surgical History  Procedure Date  . Cesarean section     Family History: No family history on file.  Social History: History  Substance Use Topics  . Smoking status: Current Some Day Smoker -- 0.2 packs/day for 3 years  . Smokeless tobacco: Not on file  . Alcohol Use: No    Allergies:  Allergies  Allergen Reactions  . Strawberry Anaphylaxis and Other (See Comments)    Reaction: throat swelling   . Aspirin Hives    Prescriptions prior to admission  Medication Sig Dispense Refill  . prenatal vitamin w/FE, FA (PRENATAL 1 + 1) 27-1 MG TABS Take 1 tablet by mouth daily.          Review of Systems : Otherwise neg Physical Exam   Blood pressure 104/65, pulse 86, temperature 98.6 F (37 C), temperature source Oral, resp. rate 18, height 5\' 8"  (1.727 m), weight 87.998 kg (194 lb), last menstrual period 04/16/2010, unknown if currently breastfeeding.  General: General appearance - alert, well appearing, and in mild distress and oriented to person, place, and time Abdomen - soft, nontender, nondistended, Gravid, S=D Focused Gynecological Exam: normal external genitalia, vulva, vagina, cervix, uterus and adnexa, VAGINA: normal appearing vagina with normal color and discharge, no lesions, vaginal discharge - white, CERVIX: normal appearing cervix without discharge or lesions, cervical discharge present - small amount of thin white odorless  discharge and clear mucus.  Labs: Crist Fat and pool neg  EFM: Category I Toco: Irreg mild UC's  Assessment: Patient Active Problem List  Diagnoses  . Supervision of other normal pregnancy  . Hx of Blunt trauma of abdominal wall   1. 36.5 week IUP 2. FHR category I 3. Intact 4. False labor  Plan: 1. D/C home 2. Labor precautions, FKCs  Porshe Fleagle 01/09/2011 6:00 AM

## 2011-01-09 NOTE — Discharge Summary (Signed)
Physician Discharge Summary  Patient ID: Anna Gregory MRN: [id number] DOB/AGE: 1986-02-03 25 y.o.  Admit date: 01/09/2011 Discharge date: 01/09/2011  Admission Diagnoses: R/O ROM Discharge Diagnoses: Intact Active Problems:  Supervision of other normal pregnancy ? Previous C/S  Discharged Condition: stable  Hospital Course: Triaged in Birthing Suites for R/O ROM  Consults: none  Significant Diagnostic Studies: Sterile speculum exam   Treatments: None  Discharge Exam: Blood pressure 104/65, pulse 86, temperature 98.6 F (37 C), temperature source Oral, height 5\' 8"  (1.727 m), weight 87.998 kg (194 lb), last menstrual period 04/16/2010, unknown if currently breastfeeding. General appearance: alert, cooperative and mild distress Pelvic: small amount of thin white, mildly malodorous discharge. Neg pooling and neg fern   NST Category I  Disposition: Home or Self Care   Current Discharge Medication List    CONTINUE these medications which have NOT CHANGED   Details  prenatal vitamin w/FE, FA (PRENATAL 1 + 1) 27-1 MG TABS Take 1 tablet by mouth daily.         Follow-up Information    Follow up with [facility] in 1 week. (or [facility] as needed if symptoms worsen)    Contact information:   [address] [place] [address]          Signed: Dorathy Kinsman 01/09/2011, 2:28 AM

## 2011-01-09 NOTE — Progress Notes (Signed)
Speculum exam for fluid. None noted per CNM view. Fern slide obtained.

## 2011-01-09 NOTE — H&P (Signed)
Attestation of Attending Supervision of Advanced Practitioner: Evaluation and management procedures were performed by the PA/NP/CNM/OB Fellow under my supervision/collaboration. Chart reviewed and agree with management and plan.  Seanne Chirico A 01/09/2011 4:56 PM

## 2011-01-09 NOTE — Progress Notes (Signed)
Pt back from walking. Ctx feel stronger. CNM at bedside

## 2011-01-10 ENCOUNTER — Encounter (HOSPITAL_COMMUNITY): Payer: Self-pay

## 2011-01-10 ENCOUNTER — Inpatient Hospital Stay (HOSPITAL_COMMUNITY): Payer: Medicaid Other

## 2011-01-10 ENCOUNTER — Encounter (HOSPITAL_COMMUNITY)
Admission: AD | Disposition: A | Discharge status: Home / Self Care | Payer: Self-pay | Source: Ambulatory Visit | Attending: Obstetrics & Gynecology

## 2011-01-10 DIAGNOSIS — Z302 Encounter for sterilization: Secondary | ICD-10-CM

## 2011-01-10 HISTORY — PX: TUBAL LIGATION: SHX77

## 2011-01-10 LAB — TYPE AND SCREEN: ABO/RH(D): A POS

## 2011-01-10 SURGERY — LIGATION, FALLOPIAN TUBE, POSTPARTUM
Anesthesia: Epidural | Site: Abdomen | Laterality: Bilateral | Wound class: Clean Contaminated

## 2011-01-10 MED ORDER — SODIUM CHLORIDE 0.9 % IR SOLN
Status: DC | PRN
  Administered 2011-01-10: 1000 mL

## 2011-01-10 MED ORDER — OXYCODONE-ACETAMINOPHEN 5-325 MG PO TABS: 1.0000 | ORAL_TABLET | Freq: Four times a day (QID) | ORAL | Status: DC | PRN

## 2011-01-10 MED ORDER — ACETAMINOPHEN 500 MG PO TABS: 1000.0000 mg | ORAL_TABLET | Freq: Four times a day (QID) | ORAL | Status: DC | PRN

## 2011-01-10 MED ORDER — PROPOFOL 10 MG/ML IV EMUL
INTRAVENOUS | Status: AC
  Filled 2011-01-10: qty 50

## 2011-01-10 MED ORDER — FAMOTIDINE 20 MG PO TABS
40.0000 mg | ORAL_TABLET | Freq: Once | ORAL | Status: AC
  Administered 2011-01-10: 40 mg via ORAL

## 2011-01-10 MED ORDER — BUPIVACAINE HCL (PF) 0.25 % IJ SOLN
INTRAMUSCULAR | Status: DC | PRN
  Administered 2011-01-10: 8 mL

## 2011-01-10 MED ORDER — LIDOCAINE HCL (CARDIAC) 20 MG/ML IV SOLN
INTRAVENOUS | Status: AC
  Filled 2011-01-10: qty 5

## 2011-01-10 MED ORDER — EPHEDRINE 5 MG/ML INJ
INTRAVENOUS | Status: AC
  Filled 2011-01-10: qty 10

## 2011-01-10 MED ORDER — FENTANYL CITRATE 0.05 MG/ML IJ SOLN
INTRAMUSCULAR | Status: DC | PRN
  Administered 2011-01-10: 50 ug via INTRAVENOUS

## 2011-01-10 MED ORDER — DEXAMETHASONE SODIUM PHOSPHATE 10 MG/ML IJ SOLN
INTRAMUSCULAR | Status: DC | PRN
  Administered 2011-01-10: 10 mg via INTRAVENOUS

## 2011-01-10 MED ORDER — MIDAZOLAM HCL 5 MG/5ML IJ SOLN
INTRAMUSCULAR | Status: DC | PRN
  Administered 2011-01-10: 2 mg via INTRAVENOUS

## 2011-01-10 MED ORDER — IBUPROFEN 600 MG PO TABS: 600.0000 mg | ORAL_TABLET | Freq: Four times a day (QID) | ORAL | Status: DC | PRN

## 2011-01-10 MED ORDER — LIDOCAINE-EPINEPHRINE (PF) 2 %-1:200000 IJ SOLN
INTRAMUSCULAR | Status: AC
  Filled 2011-01-10: qty 20

## 2011-01-10 MED ORDER — METOCLOPRAMIDE HCL 10 MG PO TABS
10.0000 mg | ORAL_TABLET | Freq: Once | ORAL | Status: AC
  Administered 2011-01-10: 10 mg via ORAL

## 2011-01-10 MED ORDER — PROPOFOL 10 MG/ML IV EMUL
INTRAVENOUS | Status: DC | PRN
  Administered 2011-01-10 (×3): 10 mg via INTRAVENOUS

## 2011-01-10 MED ORDER — ONDANSETRON HCL 4 MG/2ML IJ SOLN
INTRAMUSCULAR | Status: AC
  Filled 2011-01-10: qty 2

## 2011-01-10 MED ORDER — MIDAZOLAM HCL 2 MG/2ML IJ SOLN
INTRAMUSCULAR | Status: AC
  Filled 2011-01-10: qty 2

## 2011-01-10 MED ORDER — DEXAMETHASONE SODIUM PHOSPHATE 10 MG/ML IJ SOLN
INTRAMUSCULAR | Status: AC
  Filled 2011-01-10: qty 1

## 2011-01-10 MED ORDER — LANOLIN HYDROUS EX OINT: TOPICAL_OINTMENT | CUTANEOUS | Status: DC | PRN

## 2011-01-10 MED ORDER — SODIUM BICARBONATE 8.4 % IV SOLN
INTRAVENOUS | Status: DC | PRN
  Administered 2011-01-10: 5 mL via EPIDURAL

## 2011-01-10 MED ORDER — ONDANSETRON HCL 4 MG/2ML IJ SOLN
INTRAMUSCULAR | Status: DC | PRN
  Administered 2011-01-10: 4 mg via INTRAVENOUS

## 2011-01-10 MED ORDER — EPHEDRINE SULFATE 50 MG/ML IJ SOLN
INTRAMUSCULAR | Status: DC | PRN
  Administered 2011-01-10 (×2): 5 mg via INTRAVENOUS

## 2011-01-10 MED ORDER — SODIUM BICARBONATE 8.4 % IV SOLN
INTRAVENOUS | Status: AC
  Filled 2011-01-10: qty 50

## 2011-01-10 MED ORDER — LACTATED RINGERS IV SOLN
INTRAVENOUS | Status: DC
  Administered 2011-01-10: 10:00:00 via INTRAVENOUS

## 2011-01-10 MED ORDER — FENTANYL CITRATE 0.05 MG/ML IJ SOLN
INTRAMUSCULAR | Status: AC
  Filled 2011-01-10: qty 2

## 2011-01-10 SURGICAL SUPPLY — 20 items
CHLORAPREP W/TINT 26ML (MISCELLANEOUS) ×2 IMPLANT
CONTAINER PREFILL 10% NBF 15ML (MISCELLANEOUS) ×4 IMPLANT
DRAPE UTILITY XL STRL (DRAPES) IMPLANT
DRSG COVADERM PLUS 2X2 (GAUZE/BANDAGES/DRESSINGS) ×2 IMPLANT
GLOVE BIOGEL PI IND STRL 6.5 (GLOVE) ×4 IMPLANT
GLOVE BIOGEL PI INDICATOR 6.5 (GLOVE) ×4
GLOVE SURG SS PI 6.0 STRL IVOR (GLOVE) ×2 IMPLANT
GOWN PREVENTION PLUS LG XLONG (DISPOSABLE) ×6 IMPLANT
NEEDLE HYPO 25X1 1.5 SAFETY (NEEDLE) ×2 IMPLANT
NS IRRIG 1000ML POUR BTL (IV SOLUTION) ×2 IMPLANT
PACK ABDOMINAL MINOR (CUSTOM PROCEDURE TRAY) ×2 IMPLANT
SPONGE LAP 4X18 X RAY DECT (DISPOSABLE) ×2 IMPLANT
SUT PLAIN 0 NONE (SUTURE) ×2 IMPLANT
SUT VIC AB 0 CT1 27 (SUTURE) ×1
SUT VIC AB 0 CT1 27XBRD ANBCTR (SUTURE) ×1 IMPLANT
SUT VIC AB 3-0 PS2 18 (SUTURE) ×2 IMPLANT
SYR CONTROL 10ML LL (SYRINGE) ×2 IMPLANT
TOWEL OR 17X24 6PK STRL BLUE (TOWEL DISPOSABLE) ×2 IMPLANT
TRAY FOLEY CATH 14FR (SET/KITS/TRAYS/PACK) ×2 IMPLANT
WATER STERILE IRR 1000ML POUR (IV SOLUTION) IMPLANT

## 2011-01-10 NOTE — Anesthesia Postprocedure Evaluation (Signed)
Anesthesia Post Note  Patient: Anna Gregory  Procedure(s) Performed:  POST PARTUM TUBAL LIGATION - Bilateral post partum tubal ligation.  Anesthesia type: Spinal  Patient location: PACU  Post pain: Pain level controlled  Post assessment: Post-op Vital signs reviewed  Last Vitals:  Filed Vitals:   01/10/11 1049  BP: 107/62  Pulse: 78  Temp: 36.6 C  Resp: 20    Post vital signs: Reviewed  Level of consciousness: awake  Complications: No apparent anesthesia complications

## 2011-01-10 NOTE — Progress Notes (Signed)
Post Partum Day 1 Subjective: no complaints, up ad lib, voiding and tolerating PO, small lochia, plans to bottle feed, BTL scheduled this am @ 0930  Objective: Blood pressure 96/58, pulse 63, temperature 98 F (36.7 C), temperature source Oral, resp. rate 18, height 5\' 7"  (1.702 m), weight 87.635 kg (193 lb 3.2 oz), last menstrual period 04/16/2010, SpO2 99.00%, unknown if currently breastfeeding.  Physical Exam:  General: alert, cooperative and no distress Lochia:normal flow Chest: CTAB Heart: RRR no m/r/g Abdomen: +BS, soft, nontender,  Uterine Fundus: firm DVT Evaluation: No evidence of DVT seen on physical exam. Extremities: no edema   Basename 01/09/11 1030  HGB 10.9*  HCT 33.4*    Assessment/Plan: Plan for discharge tomorrow   LOS: 1 day   CRESENZO-DISHMAN,Miette Molenda 01/10/2011, 7:30 AM

## 2011-01-10 NOTE — Anesthesia Postprocedure Evaluation (Signed)
  Anesthesia Post-op Note  Patient: Anna Gregory  Procedure(s) Performed: * No procedures listed *  Patient Location: Mother/Baby  Anesthesia Type: Epidural  Level of Consciousness: awake, alert  and oriented  Airway and Oxygen Therapy: Patient Spontanous Breathing  Post-op Pain: mild  Post-op Assessment: Patient's Cardiovascular Status Stable, Respiratory Function Stable, Patent Airway and No signs of Nausea or vomiting  Post-op Vital Signs: stable  Complications: No apparent anesthesia complications

## 2011-01-10 NOTE — Anesthesia Procedure Notes (Addendum)
Spinal Block  Patient location during procedure: OR Start time: 01/10/2011 10:08 AM End time: 01/10/2011 10:14 AM Staffing Anesthesiologist: Sandrea Hughs Performed by: anesthesiologist  Preanesthetic Checklist Completed: patient identified, site marked, surgical consent, pre-op evaluation, timeout performed, IV checked, risks and benefits discussed and monitors and equipment checked Spinal Block Patient position: sitting Prep: DuraPrep Patient monitoring: heart rate, cardiac monitor, continuous pulse ox and blood pressure Approach: midline Location: L3-4 Injection technique: single-shot Needle Needle type: Sprotte  Needle gauge: 24 G Needle length: 9 cm Needle insertion depth: 7 cm Assessment Sensory level: T10 Additional Notes Procedure was necessary because in-situ epidural failed.  Epidural

## 2011-01-10 NOTE — Anesthesia Preprocedure Evaluation (Signed)
Anesthesia Evaluation  Name, MR# and DOB Patient awake  General Assessment Comment  Reviewed: Allergy & Precautions, H&P , NPO status , Patient's Chart, lab work & pertinent test results, reviewed documented beta blocker date and time   Airway Mallampati: I TM Distance: >3 FB Neck ROM: full    Dental No notable dental hx. (+) Teeth Intact   Pulmonary    Pulmonary exam normal       Cardiovascular     Neuro/Psych PSYCHIATRIC DISORDERS Depression Negative Neurological ROS     GI/Hepatic negative GI ROS Neg liver ROS    Endo/Other  Negative Endocrine ROS  Renal/GU negative Renal ROS  Genitourinary negative   Musculoskeletal   Abdominal Normal abdominal exam  (+)   Peds negative pediatric ROS (+)  Hematology negative hematology ROS (+)   Anesthesia Other Findings   Reproductive/Obstetrics negative OB ROS                           Anesthesia Physical Anesthesia Plan  ASA: II  Anesthesia Plan: Epidural   Post-op Pain Management:    Induction:   Airway Management Planned:   Additional Equipment:   Intra-op Plan:   Post-operative Plan:   Informed Consent: I have reviewed the patients History and Physical, chart, labs and discussed the procedure including the risks, benefits and alternatives for the proposed anesthesia with the patient or authorized representative who has indicated his/her understanding and acceptance.   Dental Advisory Given and History available from chart only  Plan Discussed with: CRNA  Anesthesia Plan Comments:         Anesthesia Quick Evaluation

## 2011-01-10 NOTE — Op Note (Signed)
NAME@ 01/09/2011 - 01/10/2011  PREOPERATIVE DIAGNOSIS:  Undesired fertility  POSTOPERATIVE DIAGNOSIS:  Undesired fertility  PROCEDURE:  Postpartum Bilateral Tubal Sterilization using Pomeroy method   ANESTHESIA:  Epidural  COMPLICATIONS:  None immediate.  ESTIMATED BLOOD LOSS:  Less than 20cc.  FLUIDS: 800 cc LR.  URINE OUTPUT:  50 cc of clear urine.  INDICATIONS: 25 y.o. yo [id number]  with undesired fertility,status post vaginal delivery, desires permanent sterilization. Risks and benefits of procedure discussed with patient including permanence of method, bleeding, infection, injury to surrounding organs and need for additional procedures. Risk failure of 0.5-1% with increased risk of ectopic gestation if pregnancy occurs was also discussed with patient.   FINDINGS:  Normal uterus, tubes, and ovaries.  TECHNIQUE: After informed consent was obtained, the patient was taken to the operating room where anesthesia was induced and found to be adequate. A small transverse, infraumbilical skin incision was made with the scalpel. This incision was carried down to the underlying layer of fascia. The fascia was grasped with Kocher clamps tented up and entered sharply with Mayo scissors. Underlying peritoneum was then identified tented up and entered sharply with Metzenbaum scissors. The fascia was tagged with 0 Vicryl. The patient's left fallopian tube was then identified, brought to the incision, and grasped with a Babcock clamp. The tube was then followed out to the fimbria. The Babcock clamp was then used to grasp the tube approximately 4 cm from the cornual region. A 3 cm segment of the tube was then ligated with free tie of plain gut suture, transected and excised. Good hemostasis was noted and the tube was returned to the abdomen. The right fallopian tube was then identified to its fimbriated end, ligated, and a 3 cm segment excised in a similar fashion. Excellent hemostasis was noted, and the tube  returned to the abdomen. The fascia was re-approximated with 0 Vicryl. The skin was closed in a subcuticular fashion with 3-0 Vicryl. Quarter percent Marcaine solution was then injected at the incision site. The patient tolerated the procedure well. Sponge, lap, and needle count were correct x2. The patient was taken to recovery room in stable condition.

## 2011-01-10 NOTE — Progress Notes (Signed)
Patient status post vaginal delivery, desires permanent sterilization. Risks and benefits of procedure discussed with patient including permanence of method, bleeding, infection, injury to surrounding organs and need for additional procedures. Risk failure of 0.5-1% with increased risk of ectopic gestation if pregnancy occurs was also discussed with patient. Patient verbalized understanding and all questions were answered. Consent was signed. Patient understands that the likelihood of achieving pregnancy following this procedure is extremely low.

## 2011-01-10 NOTE — Transfer of Care (Signed)
Immediate Anesthesia Transfer of Care Note  Patient: Anna Gregory  Procedure(s) Performed:  POST PARTUM TUBAL LIGATION - Bilateral post partum tubal ligation.  Patient Location: PACU  Anesthesia Type: Spinal  Level of Consciousness: awake, alert  and oriented  Airway & Oxygen Therapy: Patient Spontanous Breathing  Post-op Assessment: Report given to PACU RN and Post -op Vital signs reviewed and stable  Post vital signs: stable  Complications: No apparent anesthesia complications

## 2011-01-11 ENCOUNTER — Encounter (HOSPITAL_COMMUNITY): Payer: Self-pay | Admitting: Obstetrics and Gynecology

## 2011-01-11 ENCOUNTER — Other Ambulatory Visit: Payer: Self-pay | Admitting: Advanced Practice Midwife

## 2011-01-11 MED ORDER — PROMETHAZINE HCL 25 MG PO TABS
25.0000 mg | ORAL_TABLET | Freq: Four times a day (QID) | ORAL | Status: DC | PRN
  Administered 2011-01-11: 25 mg via ORAL
  Filled 2011-01-11: qty 1

## 2011-01-11 MED ORDER — OXYCODONE-ACETAMINOPHEN 5-325 MG PO TABS: 1.0000 | ORAL_TABLET | ORAL | Status: AC | PRN

## 2011-01-11 MED ORDER — SENNOSIDES-DOCUSATE SODIUM 8.6-50 MG PO TABS: 2.0000 | ORAL_TABLET | Freq: Every day | ORAL | Status: DC

## 2011-01-11 NOTE — Progress Notes (Signed)
PSYCHOSOCIAL ASSESSMENT ~ MATERNAL/CHILD  Name: Anna Gregory Age: 25  Referral Date: 10/ 22 /12  Reason/Source: History of MJ and Depression / CN  I. FAMILY/HOME ENVIRONMENT  A. Child's Legal Guardian _X__Parent(s) ___Grandparent ___Foster parent ___DSS_________________  Name: Anna Gregory DOB: // Age: 25  Address: [address] ; [place], [place] [address]  Name: Anna Gregory DOB: // Age: 23  Address:  B. Other Household Members/Support Persons Name: Anna Gregory Relationship: mom DOB ___/___/___  Name: Relationship: brother 19yrs DOB ___/___/___  Name: Tayelynn Gregory Relationship: daughter DOB ___/___/___  Name: Relationship: DOB ___/___/___  C. Other Support:  II. PSYCHOSOCIAL DATA A. Information Source _X_Patient Interview __Family Interview __Other___________ B. Financial and Community Resources __Employment:  _X_Medicaid County: [place] __Private Insurance: __Self Pay  _X_Food Stamps _X_WIC __Work First __Public Housing __Section 8  __Maternity Care Coordination/Child Service Coordination/Early Intervention  ___School: Grade:  __Other:  C. Cultural and Environment Information Cultural Issues Impacting Care:  III. STRENGTHS __X_Supportive family/friends  __X_Adequate Resources  ___Compliance with medical plan  __X_Home prepared for Child (including basic supplies)  ___Understanding of illness  ___Other:  RISK FACTORS AND CURRENT PROBLEMS ____No Problems Noted  History of MJ use  Depression  IV. SOCIAL WORK ASSESSMENT Pt admits to smoking MJ once a day prior to pregnancy confirmation at 3 1/2 weeks. Once pregnancy was confirmed, she decreased use to "once every two weeks." Pt last smoked, 2 weeks ago. She denies other illegal substances. Sw informed pt of positive UDS screen and advised that a CPS report would be made. Pt verbalized understanding. Pt lives with her mother, bother and 1 year old daughter. Pt's other children live with their fathers in [place]. She denies any  involvement with CPS. Pt acknowledged depression symptoms at age 16 but none since then. She denies SI history. Pt has all the supplies she needs for the infant and appears to be appropriate. Sw will call CPS to report UDS results and assist pt further if needed.  V. SOCIAL WORK PLAN __X_No Further Intervention Required/No Barriers to Discharge  ___Psychosocial Support and Ongoing Assessment of Needs  ___Patient/Family Education:  __X_Child Protective Services Report County: [place] Date 01/11/11  ___Information/Referral to Community Resources_________________________  ___Other:    __Other___________  B. Surveyor, quantity and [employer] __Employment: _X_Medicaid    [place]: [place]                __Private Insurance:                   __Self Pay  _X_Food Stamps   _X_WIC __Work First     __Public Housing     __Section 8    __Maternity Care Coordination/Child Service Coordination/Early Intervention   ___School:                                                                         Grade:  __Other:   Salena Saner Cultural and Environment Information Cultural Issues Impacting Care:  III. STRENGTHS __X_Supportive family/friends __X_Adequate Resources ___Compliance with medical plan __X_Home prepared for Child (including basic  supplies) ___Understanding of illness      ___Other: RISK FACTORS AND CURRENT PROBLEMS         ____No Problems Noted      History of MJ use  Depression                                                                                                                                                                                                                                               IV. SOCIAL WORK ASSESSMENT  Pt admits to smoking MJ once a day prior to pregnancy confirmation at 3 1/2 weeks.  Once pregnancy was confirmed, she decreased use to "once every two weeks."  Pt last smoked, 2 weeks ago.  She denies other illegal substances.  Sw informed pt of positive UDS screen and advised that a CPS report would be made.  Pt verbalized understanding.  Pt lives with her mother, bother and 89 year old daughter.  Pt's other children live with their fathers in [place].  She denies any involvement with CPS.  Pt acknowledged depression symptoms at age 31 but none since then.  She denies SI history. Pt has all the supplies she needs for the infant and appears to be appropriate.  Sw will call CPS to report UDS results  and assist pt further if needed.    V. SOCIAL WORK PLAN  __X_No Further Intervention Required/No Barriers to Discharge   ___Psychosocial Support and Ongoing Assessment of Needs   ___Patient/Family Education:   __X_Child Protective Services Report   County: [place] Date 01/11/11   ___Information/Referral to [employer] Resources_________________________   ___Other:

## 2011-01-11 NOTE — Progress Notes (Signed)
Post Partum Day 2 Subjective: voiding, tolerating PO and + flatus. Patient complaints of generalized crampping abdominal pain since delivery which had increased after the bilateral tubal ligation (her choice of contraception). However, she is now on Percocet 5-325 mg po 2 tabs and Phenergan 25 mg po 1 tab and she feels better. Bleeding PV is appropriate for post partum day 2, no fever, she is mobile and does not have any discharge, redness or change in temperature at incision site for the tubal ligation. Baby is doing well and bottle fed. She has no other complaints and her mood is good.  Objective: Temp:  [98.1 F (36.7 C)-98.6 F (37 C)] 98.2 F (36.8 C) (10/22 0421) Pulse Rate:  [62-75] 65  (10/22 0421) Resp:  [16-41] 18  (10/22 0421) BP: (101-110)/(57-74) 106/62 mmHg (10/22 0421) SpO2:  [97 %-100 %] 100 % (10/21 1346)   Physical Exam:  General: alert, cooperative, appears stated age and mild distress} Uterine Fundus: firm Incision: healing well, no significant drainage, no dehiscence, no significant erythema DVT Evaluation: No evidence of DVT seen on physical exam.   Basename 01/09/11 1030  HGB 10.9*  HCT 33.4*    Assessment/Plan: Discharge home prepare patient for discharge today Abdominal pain: patient will be discharged home with prescription for the pain. Patient has been informed that the pain is consistent with the expectation following the delivery and bilateral tubal ligation. However, she needs to return if the pain does not improve or gets worse.   LOS: 2 days   Chetan Kapat 01/11/2011, 9:03 AM    I have read and agree with above.  Si Raider Maryann Conners.D.

## 2011-01-11 NOTE — Progress Notes (Signed)
UR chart review completed.  

## 2011-01-11 NOTE — Discharge Summary (Signed)
Obstetric Discharge Summary Reason for Admission: onset of labor Prenatal Procedures: none, US anatomy Intrapartum Procedures: spontaneous vaginal delivery Postpartum Procedures: P.P. tubal ligation Complications-Operative and Postpartum: none Hemoglobin  Date Value Range Status  01/09/2011 10.9* 12.0-15.0 (g/dL) Final     HCT  Date Value Range Status  01/09/2011 33.4* 36.0-46.0 (%) Final    Discharge Diagnoses: Term Pregnancy-delivered  Discharge Information: Date: 01/11/2011 Activity: unrestricted Diet: routine Medications: Colace and Percocet Condition: stable Instructions: refer to practice specific booklet Discharge to: home   Newborn Data: Live born female  Birth Weight: 6 lb 9.3 oz (2985 g) APGAR: 9, 9  Home with mother.  Chetan Kapat 01/11/2011, 8:51 AM  I saw the patient and agree with note .Eisha Chatterjee 01/11/11

## 2011-10-06 ENCOUNTER — Encounter (HOSPITAL_COMMUNITY): Payer: Self-pay

## 2011-10-06 ENCOUNTER — Inpatient Hospital Stay (HOSPITAL_COMMUNITY)
Admission: AD | Admit: 2011-10-06 | Discharge: 2011-10-06 | Disposition: A | Discharge status: Home / Self Care | Payer: Medicaid Other | Source: Ambulatory Visit | Attending: Family Medicine | Admitting: Family Medicine

## 2011-10-06 DIAGNOSIS — Z3202 Encounter for pregnancy test, result negative: Secondary | ICD-10-CM

## 2011-10-06 DIAGNOSIS — N946 Dysmenorrhea, unspecified: Secondary | ICD-10-CM

## 2011-10-06 HISTORY — DX: Chlamydial infection, unspecified: A74.9

## 2011-10-06 HISTORY — DX: Urinary tract infection, site not specified: N39.0

## 2011-10-06 HISTORY — DX: Trichomoniasis, unspecified: A59.9

## 2011-10-06 LAB — WET PREP, GENITAL
Clue Cells Wet Prep HPF POC: NONE SEEN
Yeast Wet Prep HPF POC: NONE SEEN

## 2011-10-06 LAB — URINALYSIS, ROUTINE W REFLEX MICROSCOPIC
Bilirubin Urine: NEGATIVE
Hgb urine dipstick: NEGATIVE
Specific Gravity, Urine: 1.025 (ref 1.005–1.030)
Urobilinogen, UA: 1 mg/dL (ref 0.0–1.0)

## 2011-10-06 MED ORDER — IBUPROFEN 800 MG PO TABS
800.0000 mg | ORAL_TABLET | Freq: Once | ORAL | Status: AC
  Administered 2011-10-06: 800 mg via ORAL
  Filled 2011-10-06: qty 1

## 2011-10-06 NOTE — MAU Provider Note (Signed)
History     CSN: [id number]  Arrival date and time: 10/06/11 7829   First Provider Initiated Contact with Patient 10/06/11 [phone]      Chief Complaint  Patient presents with  . Possible Pregnancy  . Vaginal Discharge   HPI Anna Gregory 26 y.o. Comes to MAU with nausea for the past few days.  Has had a BTL but thinking she might be pregnant.  Did a home pregnancy test which was positive.  Then started bleeding last night and had clotting and "chunks".  Bleeding usually is on around the 25th of the month so client was thinking she is pregnant.  Having some abdominal pain and took one ibuprofen last night.  [facility]  OB History    Grav Para Term Preterm Abortions TAB SAB Ect Mult Living   7 5 5  0 2 1 1   5       Past Medical History  Diagnosis Date  . PONV (postoperative nausea and vomiting)   . Depression   . Urinary tract infection   . Chlamydia   . Trichomonas     Past Surgical History  Procedure Date  . Cesarean section   . Tubal ligation 01/10/2011    Procedure: POST PARTUM TUBAL LIGATION;  Surgeon: Catalina Antigua, MD;  Location: [facility];  Service: Gynecology;  Laterality: Bilateral;  Bilateral post partum tubal ligation.    Family History  Problem Relation Age of Onset  . Hypertension Maternal Grandmother   . Diabetes Paternal Grandfather     History  Substance Use Topics  . Smoking status: Current Some Day Smoker -- 0.2 packs/day for 3 years  . Smokeless tobacco: Never Used  . Alcohol Use: No    Allergies:  Allergies  Allergen Reactions  . Strawberry Anaphylaxis and Other (See Comments)    Reaction: throat swelling   . Aspirin Hives    Prescriptions prior to admission  Medication Sig Dispense Refill  . ibuprofen (ADVIL,MOTRIN) 600 MG tablet Take 600 mg by mouth every 6 (six) hours as needed. For pain        Review of Systems  Constitutional: Negative for fever.  Gastrointestinal: Positive for nausea and  abdominal pain. Negative for vomiting.  Genitourinary: Negative for dysuria.       Vaginal bleeding   Physical Exam   Blood pressure 111/63, pulse 61, temperature 98.2 F (36.8 C), temperature source Oral, resp. rate 16, height 5\' 8"  (1.727 m), weight 199 lb 12.8 oz (90.629 kg), last menstrual period 09/14/2011, SpO2 100.00%, unknown if currently breastfeeding.  Physical Exam  Nursing note and vitals reviewed. Constitutional: She is oriented to person, place, and time. She appears well-developed and well-nourished.  HENT:  Head: Normocephalic.  Eyes: EOM are normal.  Neck: Neck supple.  GI: Soft. There is tenderness. There is no rebound and no guarding.  Genitourinary:       Speculum exam: Vulva - negative Vagina - Small amount of blood, no odor Cervix - small amount of active bleeding Bimanual exam: Cervix closed Uterus mildly tender, normal size Adnexa non tender, no masses bilaterally GC/Chlam, wet prep done Chaperone present for exam.  Musculoskeletal: Normal range of motion.  Neurological: She is alert and oriented to person, place, and time.  Skin: Skin is warm and dry.  Psychiatric: She has a normal mood and affect.    MAU Course  Procedures Results for orders placed during the hospital encounter of 10/06/11 (from the past 24 hour(s))  URINALYSIS, ROUTINE W REFLEX MICROSCOPIC     Status: Normal   Collection Time   10/06/11  8:25 AM      Component Value Range   Color, Urine YELLOW  YELLOW   APPearance CLEAR  CLEAR   Specific Gravity, Urine 1.025  1.005 - 1.030   pH 6.0  5.0 - 8.0   Glucose, UA NEGATIVE  NEGATIVE mg/dL   Hgb urine dipstick NEGATIVE  NEGATIVE   Bilirubin Urine NEGATIVE  NEGATIVE   Ketones, ur NEGATIVE  NEGATIVE mg/dL   Protein, ur NEGATIVE  NEGATIVE mg/dL   Urobilinogen, UA 1.0  0.0 - 1.0 mg/dL   Nitrite NEGATIVE  NEGATIVE   Leukocytes, UA NEGATIVE  NEGATIVE  WET PREP, GENITAL     Status: Abnormal   Collection Time   10/06/11  9:00 AM       Component Value Range   Yeast Wet Prep HPF POC NONE SEEN  NONE SEEN   Trich, Wet Prep NONE SEEN  NONE SEEN   Clue Cells Wet Prep HPF POC NONE SEEN  NONE SEEN   WBC, Wet Prep HPF POC FEW (*) NONE SEEN   MAU pregnancy test is negative today.  MDM Ibuprofen 800 mg PO for pain  Assessment and Plan  Dysmenorrhea Negative pregnancy test  Plan Can take Ibuprofen by the package directions for pain relief. [facility] if you are continuing to have problems. Cultures are pending and we will notify you if you need any treatment.   BURLESON,TERRI 10/06/2011, 9:03 AM

## 2011-10-06 NOTE — MAU Provider Note (Signed)
Chart reviewed and agree with management and plan.  

## 2011-10-06 NOTE — MAU Note (Signed)
Patient states she had her tubes tied in 2012. States she had a positive pregnancy test at home yesterday. Has had a "chunky" bloody discharge last night. Abdominal pain started a couple of days ago.

## 2011-10-07 LAB — GC/CHLAMYDIA PROBE AMP, GENITAL: Chlamydia, DNA Probe: NEGATIVE

## 2011-11-29 ENCOUNTER — Emergency Department (HOSPITAL_COMMUNITY)
Admission: EM | Admit: 2011-11-29 | Discharge: 2011-11-29 | Disposition: A | Discharge status: Home / Self Care | Payer: Medicaid Other | Source: Home / Self Care | Attending: Family Medicine | Admitting: Family Medicine

## 2011-11-29 ENCOUNTER — Encounter (HOSPITAL_COMMUNITY): Payer: Self-pay | Admitting: *Deleted

## 2011-11-29 ENCOUNTER — Emergency Department (INDEPENDENT_AMBULATORY_CARE_PROVIDER_SITE_OTHER): Payer: Medicaid Other

## 2011-11-29 DIAGNOSIS — M25539 Pain in unspecified wrist: Secondary | ICD-10-CM

## 2011-11-29 DIAGNOSIS — M25532 Pain in left wrist: Secondary | ICD-10-CM

## 2011-11-29 MED ORDER — OXYCODONE-ACETAMINOPHEN 5-325 MG PO TABS: 2.0000 | ORAL_TABLET | ORAL | Status: AC | PRN

## 2011-11-29 MED ORDER — HYDROCODONE-ACETAMINOPHEN 5-325 MG PO TABS
1.0000 | ORAL_TABLET | Freq: Once | ORAL | Status: AC
  Administered 2011-11-29: 1 via ORAL

## 2011-11-29 MED ORDER — HYDROCODONE-ACETAMINOPHEN 5-325 MG PO TABS
ORAL_TABLET | ORAL | Status: AC
  Filled 2011-11-29: qty 1

## 2011-11-29 MED ORDER — IBUPROFEN 600 MG PO TABS: 600.0000 mg | ORAL_TABLET | Freq: Four times a day (QID) | ORAL | Status: AC | PRN

## 2011-11-29 MED ORDER — TRAMADOL HCL 50 MG PO TABS: 50.0000 mg | ORAL_TABLET | Freq: Four times a day (QID) | ORAL | Status: AC | PRN

## 2011-11-29 MED ORDER — KETOROLAC TROMETHAMINE 60 MG/2ML IM SOLN
60.0000 mg | Freq: Once | INTRAMUSCULAR | Status: AC
Start: 2011-11-29 — End: 2011-11-29
  Administered 2011-11-29: 60 mg via INTRAMUSCULAR

## 2011-11-29 MED ORDER — KETOROLAC TROMETHAMINE 60 MG/2ML IM SOLN
INTRAMUSCULAR | Status: AC
  Filled 2011-11-29: qty 2

## 2011-11-29 NOTE — ED Notes (Signed)
Reports ASA allergy; states can take Motrin and Tylenol without any problems.  Verified order of adminstration of Toradol w/ C. Chatten, NP.  Informed pt we need to evaluate her in 20 min to ensure she does not have an allergic rxn to injection.  Educated on S/S of allergic reaction.

## 2011-11-29 NOTE — ED Provider Notes (Signed)
History     CSN: [id number]  Arrival date & time 11/29/11  1750   First MD Initiated Contact with Patient 11/29/11 1923      Chief Complaint  Patient presents with  . Wrist Pain    (Consider location/radiation/quality/duration/timing/severity/associated sxs/prior treatment) Patient is a 26 y.o. female presenting with wrist pain. The history is provided by the patient.  Wrist Pain   Anna Gregory is a 26 y.o. female who sustained a left wrist injury at 0200 this morning.  Mechanism of injury: flipped over railing of porch while playing with sibling, attempted to break fall with left hand.  Immediate symptoms: pain. Symptoms have been increasing worse since that time. No prior history of related problems.  Applied ice at time of incident, has not taken medication for pain.  Expresses concern     Past Medical History  Diagnosis Date  . PONV (postoperative nausea and vomiting)   . Depression   . Urinary tract infection   . Chlamydia   . Trichomonas     Past Surgical History  Procedure Date  . Cesarean section   . Tubal ligation 01/10/2011    Procedure: POST PARTUM TUBAL LIGATION;  Surgeon: Catalina Antigua, MD;  Location: [facility];  Service: Gynecology;  Laterality: Bilateral;  Bilateral post partum tubal ligation.    Family History  Problem Relation Age of Onset  . Hypertension Maternal Grandmother   . Diabetes Paternal Grandfather     History  Substance Use Topics  . Smoking status: Current Some Day Smoker -- 0.2 packs/day for 3 years  . Smokeless tobacco: Never Used  . Alcohol Use: No    OB History    Grav Para Term Preterm Abortions TAB SAB Ect Mult Living   7 5 5  0 2 1 1   5       Review of Systems  Constitutional: Negative.   Respiratory: Negative.   Cardiovascular: Negative.   Musculoskeletal: Positive for joint swelling and arthralgias. Negative for myalgias, back pain and gait problem.    Allergies  Strawberry and Aspirin  Home Medications    Current Outpatient Rx  Name Route Sig Dispense Refill  . IBUPROFEN 600 MG PO TABS Oral Take 600 mg by mouth every 6 (six) hours as needed. For pain    . IBUPROFEN 600 MG PO TABS Oral Take 1 tablet (600 mg total) by mouth every 6 (six) hours as needed for pain. 30 tablet 0  . OXYCODONE-ACETAMINOPHEN 5-325 MG PO TABS Oral Take 2 tablets by mouth every 4 (four) hours as needed for pain. 6 tablet 0  . TRAMADOL HCL 50 MG PO TABS Oral Take 1 tablet (50 mg total) by mouth every 6 (six) hours as needed for pain. 15 tablet 0    BP 122/78  Pulse 80  Temp 98.6 F (37 C) (Oral)  Resp 18  SpO2 100%  LMP 11/04/2011  Breastfeeding? Unknown  Physical Exam  Nursing note and vitals reviewed. Constitutional: She is oriented to person, place, and time. Vital signs are normal. She appears well-developed and well-nourished. She is active and cooperative.  HENT:  Head: Normocephalic.  Eyes: Conjunctivae are normal. Pupils are equal, round, and reactive to light. No scleral icterus.  Neck: Trachea normal. Neck supple.  Cardiovascular: Normal rate and regular rhythm.   Pulmonary/Chest: Effort normal and breath sounds normal.  Musculoskeletal:       Left wrist: She exhibits decreased range of motion, tenderness and swelling. She exhibits no bony tenderness, no effusion,  no crepitus, no deformity and no laceration.       Left hand: She exhibits no bony tenderness, normal capillary refill, no deformity, no laceration and no swelling. decreased sensation noted. Decreased sensation is present in the radial distribution. Decreased sensation is not present in the ulnar distribution and is not present in the medial redistribution. Decreased strength noted. She exhibits thumb/finger opposition. She exhibits no finger abduction and no wrist extension trouble.  Neurological: She is alert and oriented to person, place, and time. No cranial nerve deficit or sensory deficit.  Skin: Skin is warm and dry.  Psychiatric:  She has a normal mood and affect. Her speech is normal and behavior is normal. Judgment and thought content normal. Cognition and memory are normal.    ED Course  Procedures (including critical care time)  Labs Reviewed - No data to display Dg Wrist Complete Left  11/29/2011  *RADIOLOGY REPORT*  Clinical Data: Larey Seat and injured left wrist yesterday, persistent radial wrist pain.  LEFT WRIST - COMPLETE 3+ VIEW  Comparison: None.  Findings: No evidence of acute or subacute fracture or dislocation. Well-preserved joint spaces.  Well-preserved bone mineral density. No intrinsic osseous abnormalities.  IMPRESSION: Normal examination.   Original Report Authenticated By: Arnell Sieving, M.D.      1. Left wrist pain       MDM  Toradol and Vicodin administered in office.  Thumb spica splint.  Follow up with ortho tomorrow for further evaluation and instructions.          Johnsie Kindred, NP 11/29/11 2008

## 2011-11-29 NOTE — ED Notes (Signed)
Pt  Reports  sev  Days  Ago  Pt  Injured  Her  l  Wrist  While   Scuffling  With her  Brother      She  Reports  She  Felled on the  Wrist  And  May  Have  Bent it back  Somewhat    She  Has  Some  Pain and  Swelling as  Well  As  Decreased  rom

## 2011-12-04 NOTE — ED Provider Notes (Signed)
Medical screening examination/treatment/procedure(s) were performed by resident physician or non-physician practitioner and as supervising physician I was immediately available for consultation/collaboration.   KINDL,JAMES DOUGLAS MD.    James D Kindl, MD 12/04/11 1037 

## 2011-12-14 ENCOUNTER — Emergency Department (HOSPITAL_COMMUNITY)
Admission: EM | Admit: 2011-12-14 | Discharge: 2011-12-15 | Disposition: A | Discharge status: Other Acute Inpatient Hospital | Payer: Medicaid Other | Attending: Emergency Medicine | Admitting: Emergency Medicine

## 2011-12-14 ENCOUNTER — Encounter (HOSPITAL_COMMUNITY): Payer: Self-pay | Admitting: Emergency Medicine

## 2011-12-14 DIAGNOSIS — T391X1A Poisoning by 4-Aminophenol derivatives, accidental (unintentional), initial encounter: Secondary | ICD-10-CM | POA: Insufficient documentation

## 2011-12-14 DIAGNOSIS — Y92009 Unspecified place in unspecified non-institutional (private) residence as the place of occurrence of the external cause: Secondary | ICD-10-CM | POA: Insufficient documentation

## 2011-12-14 DIAGNOSIS — N390 Urinary tract infection, site not specified: Secondary | ICD-10-CM

## 2011-12-14 DIAGNOSIS — T50901A Poisoning by unspecified drugs, medicaments and biological substances, accidental (unintentional), initial encounter: Secondary | ICD-10-CM

## 2011-12-14 LAB — URINALYSIS, ROUTINE W REFLEX MICROSCOPIC
Bilirubin Urine: NEGATIVE
Ketones, ur: NEGATIVE mg/dL
Nitrite: POSITIVE — AB
Protein, ur: NEGATIVE mg/dL
Urobilinogen, UA: 1 mg/dL (ref 0.0–1.0)

## 2011-12-14 LAB — RAPID URINE DRUG SCREEN, HOSP PERFORMED
Amphetamines: NOT DETECTED
Barbiturates: NOT DETECTED
Benzodiazepines: NOT DETECTED
Tetrahydrocannabinol: POSITIVE — AB

## 2011-12-14 LAB — COMPREHENSIVE METABOLIC PANEL
ALT: 12 U/L (ref 0–35)
AST: 11 U/L (ref 0–37)
CO2: 25 mEq/L (ref 19–32)
Chloride: 103 mEq/L (ref 96–112)
GFR calc Af Amer: >90 mL/min (ref >90)
GFR calc non Af Amer: >90 mL/min (ref >90)
Glucose, Bld: 82 mg/dL (ref 70–99)
Sodium: 138 mEq/L (ref 135–145)
Total Bilirubin: 0.2 mg/dL — ABNORMAL LOW (ref 0.3–1.2)

## 2011-12-14 LAB — CBC
Hemoglobin: 11.8 g/dL — ABNORMAL LOW (ref 12.0–15.0)
MCV: 87.8 fL (ref 78.0–100.0)
Platelets: 234 10*3/uL (ref 150–400)
RBC: 4.02 MIL/uL (ref 3.87–5.11)
WBC: 9.5 10*3/uL (ref 4.0–10.5)

## 2011-12-14 LAB — URINE MICROSCOPIC-ADD ON

## 2011-12-14 LAB — GLUCOSE, CAPILLARY: Glucose-Capillary: 86 mg/dL (ref 70–99)

## 2011-12-14 LAB — PREGNANCY, URINE: Preg Test, Ur: NEGATIVE

## 2011-12-14 MED ORDER — ONDANSETRON HCL 4 MG PO TABS: 4.0000 mg | ORAL_TABLET | Freq: Three times a day (TID) | ORAL | Status: DC | PRN

## 2011-12-14 MED ORDER — NICOTINE 21 MG/24HR TD PT24
21.0000 mg | MEDICATED_PATCH | Freq: Every day | TRANSDERMAL | Status: DC
  Administered 2011-12-15: 21 mg via TRANSDERMAL
  Filled 2011-12-14: qty 1

## 2011-12-14 MED ORDER — ZOLPIDEM TARTRATE 5 MG PO TABS
10.0000 mg | ORAL_TABLET | Freq: Every evening | ORAL | Status: DC | PRN
  Administered 2011-12-14: 10 mg via ORAL
  Filled 2011-12-14: qty 2

## 2011-12-14 MED ORDER — LORAZEPAM 1 MG PO TABS
2.0000 mg | ORAL_TABLET | Freq: Four times a day (QID) | ORAL | Status: DC | PRN
  Administered 2011-12-14: 2 mg via ORAL
  Filled 2011-12-14: qty 2

## 2011-12-14 MED ORDER — ALUM & MAG HYDROXIDE-SIMETH 200-200-20 MG/5ML PO SUSP: 30.0000 mL | ORAL | Status: DC | PRN

## 2011-12-14 MED ORDER — SODIUM CHLORIDE 0.9 % IV SOLN
Freq: Once | INTRAVENOUS | Status: AC
  Administered 2011-12-14: 02:00:00 via INTRAVENOUS

## 2011-12-14 NOTE — ED Provider Notes (Signed)
Pt is medically cleared.  telepsych recommended admission and prn ativan.  Gwyneth Sprout, MD 12/14/11 1924

## 2011-12-14 NOTE — Progress Notes (Signed)
[facility] ED CM spoke with pt who confirms "Anna Gregory" (from [place]) can visit her Cm reviewed the visitation hours with "Anna Gregory"

## 2011-12-14 NOTE — ED Notes (Signed)
Report received from Previous RN. Sitter present at bedside, alert, oriented, boyfriend at bedside.

## 2011-12-14 NOTE — ED Notes (Signed)
Brought in by EMS from home with c/o drug overdose.  Per EMS, pt was unresponsive and on floor upon their arrival; pt took 14 tablets of Pain Relief PM tablets at 2300 to help her go to sleep.  Per EMS, pt was responsive to painful stimuli en route to ED.  Pt presents to ED alert and responsive to voice.

## 2011-12-14 NOTE — ED Notes (Signed)
Pt used telephone

## 2011-12-14 NOTE — BH Assessment (Signed)
Assessment Note   Anna Gregory is an 26 y.o. female that presented to [facility] post overdose.  Pt states that she cannot remember the events of last night or how she got to the ED, but "I know I didn't call 911."  Pt reports living with her mother and being under a lot of stress, including financial and interpersonal, though she will not elaborate "It's too much to get into."  Pt voices not sleeping well "only 3 1/2- 4 hours a night" so she admits taking Tylenol PM in an attempt to sleep.  Pt does admit feelings "stressed and overwhelmed about everything" but denies current active SI "I wanted to sleep and I wanted everyone to leave me alone."  Pt denies any prior psychiatric treatment or any previous suicide attempts or hospitalizations.  Pt does voice "seeing someone when I was younger for anger problems, but I can't remember when or who."  Pt denies any substance abuse though she did test + for Cannibus.  Pt denies current thoughts of harm to herself or others and states "I just want to go home and rest."  Pt denies HI or any active psychosis.  Pt was placed on involuntary commitment last night d/t agitation and refusing to complete a thorough examination/contract for safety.  Writer feels that pt would benefit from a Telespych consultation to determine if she can be released with mental health referrals.  Writer relayed information to Geographical information systems officer and RN and order will be completed.  Awaiting Telepsych results to determine patient needs for inpatient versus outpatient care.    Axis I: Rule out Major Depression Axis II: Deferred Axis III:  Past Medical History  Diagnosis Date  . PONV (postoperative nausea and vomiting)   . Depression   . Urinary tract infection   . Chlamydia   . Trichomonas    Axis IV: economic problems, occupational problems, other psychosocial or environmental problems and problems related to social environment Axis V: 31-40 impairment in reality testing  Past Medical  History:  Past Medical History  Diagnosis Date  . PONV (postoperative nausea and vomiting)   . Depression   . Urinary tract infection   . Chlamydia   . Trichomonas     Past Surgical History  Procedure Date  . Cesarean section   . Tubal ligation 01/10/2011    Procedure: POST PARTUM TUBAL LIGATION;  Surgeon: Catalina Antigua, MD;  Location: [facility];  Service: Gynecology;  Laterality: Bilateral;  Bilateral post partum tubal ligation.    Family History:  Family History  Problem Relation Age of Onset  . Hypertension Maternal Grandmother   . Diabetes Paternal Grandfather     Social History:  reports that she has been smoking.  She has never used smokeless tobacco. She reports that she does not drink alcohol or use illicit drugs.  Additional Social History:  Alcohol / Drug Use Pain Medications: See MAR Prescriptions: See MAR Over the Counter: See MAR History of alcohol / drug use?: Yes (Pt denies hx of substance abuse but she is + for Cannibus) Substance #1 Name of Substance 1: Cannibus 1 - Age of First Use: unknown- pt won't say 1 - Amount (size/oz): unknown- pt won't say 1 - Frequency: unknown- pt would not admit to use 1 - Duration: unknown 1 - Last Use / Amount: unknown  CIWA: CIWA-Ar BP: 113/64 mmHg Pulse Rate: 80  COWS:    Allergies:  Allergies  Allergen Reactions  . Strawberry Anaphylaxis and Other (See Comments)  Reaction: throat swelling   . Aspirin Hives    Pt can take ibuprofen    Home Medications:  (Not in a hospital admission)  OB/GYN Status:  Patient's last menstrual period was 11/04/2011.  General Assessment Data Location of Assessment: [facility] ED ACT Assessment: Yes Living Arrangements: Parent Can pt return to current living arrangement?: Yes Admission Status: Involuntary Is patient capable of signing voluntary admission?: No Transfer from: Acute Hospital Referral Source: MD  Education Status Is patient currently in school?: No  Risk to  self Suicidal Ideation: No Suicidal Intent: No Is patient at risk for suicide?: Yes Suicidal Plan?: No Access to Means: Yes Specify Access to Suicidal Means: pills and sharps available when not in ED What has been your use of drugs/alcohol within the last 12 months?: Pt denies abuse of substances but was + for Cannibus Previous Attempts/Gestures: No How many times?: 0  Other Self Harm Risks: impulsive Triggers for Past Attempts: Other (Comment) (pt denies previous attempts) Intentional Self Injurious Behavior: Damaging Comment - Self Injurious Behavior: pt took 10 Tylenol PM, reportedly for "sleep" Family Suicide History: No Recent stressful life event(s): Turmoil (Comment);Conflict (Comment);Financial Problems Persecutory voices/beliefs?: No Depression: Yes Depression Symptoms: Insomnia;Feeling worthless/self pity;Feeling angry/irritable Substance abuse history and/or treatment for substance abuse?: No Suicide prevention information given to non-admitted patients: Not applicable  Risk to Others Homicidal Ideation: No Thoughts of Harm to Others: No Current Homicidal Intent: No Current Homicidal Plan: No Access to Homicidal Means: No Identified Victim: none per pt History of harm to others?: No Assessment of Violence: In distant past Violent Behavior Description: 'I used to have anger issues" Does patient have access to weapons?: No Criminal Charges Pending?: No Does patient have a court date: No  Psychosis Hallucinations: None noted Delusions: None noted  Mental Status Report Appear/Hygiene: Disheveled Eye Contact: Fair Motor Activity: Unremarkable Speech: Soft;Slow;Logical/coherent Level of Consciousness: Quiet/awake Mood: Depressed;Anxious;Suspicious;Apprehensive;Sullen Affect: Anxious;Apprehensive;Appropriate to circumstance Anxiety Level: Minimal Thought Processes: Relevant Judgement: Impaired Orientation: Person;Place;Time Obsessive Compulsive  Thoughts/Behaviors: None  Cognitive Functioning Concentration: Decreased Memory: Recent Impaired;Remote Intact IQ: Average Insight: Fair Impulse Control: Poor Appetite: Fair Weight Loss: 0  Weight Gain: 0  Sleep: Decreased Total Hours of Sleep:  ( 3 1/2 - 4) Vegetative Symptoms: None  ADLScreening [facility] Assessment Services) Patient's cognitive ability adequate to safely complete daily activities?: Yes Patient able to express need for assistance with ADLs?: Yes Independently performs ADLs?: Yes (appropriate for developmental age)  Abuse/Neglect [facility]) Physical Abuse: Denies (Mother and "others") Verbal Abuse: Denies Sexual Abuse: Denies  Prior Inpatient Therapy Prior Inpatient Therapy: No Prior Therapy Dates: none per pt Prior Therapy Facilty/Provider(s): none per pt Reason for Treatment: none per pt  Prior Outpatient Therapy Prior Outpatient Therapy: No Prior Therapy Dates: n/a Prior Therapy Facilty/Provider(s): no Reason for Treatment: n/a  ADL Screening (condition at time of admission) Patient's cognitive ability adequate to safely complete daily activities?: Yes Patient able to express need for assistance with ADLs?: Yes Independently performs ADLs?: Yes (appropriate for developmental age)       Abuse/Neglect Assessment (Assessment to be complete while patient is alone) Physical Abuse: Denies (Mother and "others") Verbal Abuse: Denies Sexual Abuse: Denies Exploitation of patient/patient's resources: Denies Self-Neglect: Denies Values / Beliefs Cultural Requests During Hospitalization: None Spiritual Requests During Hospitalization: None   Advance Directives (For Healthcare) Advance Directive: Patient does not have advance directive    Additional Information 1:1 In Past 12 Months?: No CIRT Risk: No Elopement Risk: No Does patient have medical clearance?:  Yes     Disposition:  Awaiting Telepsych results to determine placement  needs. Disposition Disposition of Patient: Referred to Patient referred to: Other (Comment) (requested Telepsych consult to determine placement needs.)  On Site Evaluation by:   Reviewed with Physician:     Angelica Ran 12/14/2011 10:33 AM

## 2011-12-14 NOTE — Progress Notes (Signed)
pt states pcp is [facility], site

## 2011-12-14 NOTE — ED Notes (Signed)
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

## 2011-12-14 NOTE — ED Provider Notes (Signed)
History     CSN: [id number]  Arrival date & time 12/14/11  0011   First MD Initiated Contact with Patient 12/14/11 0016      Chief Complaint  Patient presents with  . Drug Overdose    (Consider location/radiation/quality/duration/timing/severity/associated sxs/prior treatment) HPI 26 year old female presents emergency department via EMS after reported overdose. Patient took 14 Tylenol PM tablets per self report and EMS. Patient reports she was "trying to get some sleep. When pointed out to her that the bottle recommends one to 2 tablets and she took 14, patient said I really wanted to sleep. Patient acknowledges that taking more than recommended doses of medicines can be dangerous and she reports she knows may kill her. When asked if this was a suicide attempt she shrugs and says I don't know. Patient is somnolent, has difficulties following the conversation, answering questions and commands. No prior history of mental problems or suicide attempt. Patient lives alone, it is unclear who called 911. She reports she may have called or texted friends and family to let to know she was "taking a bunch of pills"  Past Medical History  Diagnosis Date  . PONV (postoperative nausea and vomiting)   . Depression   . Urinary tract infection   . Chlamydia   . Trichomonas     Past Surgical History  Procedure Date  . Cesarean section   . Tubal ligation 01/10/2011    Procedure: POST PARTUM TUBAL LIGATION;  Surgeon: Catalina Antigua, MD;  Location: [facility];  Service: Gynecology;  Laterality: Bilateral;  Bilateral post partum tubal ligation.    Family History  Problem Relation Age of Onset  . Hypertension Maternal Grandmother   . Diabetes Paternal Grandfather     History  Substance Use Topics  . Smoking status: Current Some Day Smoker -- 0.2 packs/day for 3 years  . Smokeless tobacco: Never Used  . Alcohol Use: No    OB History    Grav Para Term Preterm Abortions TAB SAB Ect Mult Living   7 5 5  0 2 1 1   5       Review of Systems  Unable to perform ROS: Mental status change    Allergies  Strawberry and Aspirin  Home Medications   Current Outpatient Rx  Name Route Sig Dispense Refill  . ACETAMINOPHEN 500 MG PO TABS Oral Take 500 mg by mouth every 6 (six) hours as needed. Pain    . DIPHENHYDRAMINE HCL 25 MG PO TABS Oral Take 25 mg by mouth every 6 (six) hours as needed. Sleep    . IBUPROFEN 600 MG PO TABS Oral Take 600 mg by mouth every 6 (six) hours as needed. For pain      BP 113/64  Pulse 80  Temp 99.6 F (37.6 C)  Resp 16  SpO2 100%  LMP 11/04/2011  Breastfeeding? Unknown  Physical Exam  Nursing note and vitals reviewed. Constitutional:       Somnolent but protecting airway  HENT:  Head: Normocephalic and atraumatic.  Right Ear: External ear normal.  Left Ear: External ear normal.  Nose: Nose normal.  Mouth/Throat: Oropharynx is clear and moist.       Facial piercings noted  Eyes: Right eye exhibits no discharge. Left eye exhibits no discharge. No scleral icterus.       Pupils are dilated, she is noted to have nystagmus.  Neck: Normal range of motion. Neck supple. No JVD present. No tracheal deviation present. No thyromegaly present.  Cardiovascular: Normal rate,  regular rhythm, normal heart sounds and intact distal pulses.  Exam reveals no gallop and no friction rub.   No murmur heard. Pulmonary/Chest: Effort normal and breath sounds normal. No stridor. No respiratory distress. She has no wheezes. She has no rales. She exhibits no tenderness.  Abdominal: Soft. Bowel sounds are normal. She exhibits no distension and no mass. There is no tenderness. There is no rebound and no guarding.  Musculoskeletal: Normal range of motion. She exhibits no edema and no tenderness.  Lymphadenopathy:    She has no cervical adenopathy.  Neurological: She exhibits normal muscle tone. Coordination normal.       She will not follow commands consistently, but appears to  have fairly normal neuro exam aside from nystagmus and slight tremors with fine movements  Skin: Skin is warm and dry. No rash noted. No erythema. No pallor.  Psychiatric:       Patient appears to be responding to internal stimuli, and looking around the room and nodding her head    ED Course  Procedures (including critical care time)  CRITICAL CARE Performed by: Olivia Mackie   Total critical care time: 60 min  Critical care time was exclusive of separately billable procedures and treating other patients.  Critical care was necessary to treat or prevent imminent or life-threatening deterioration.  Critical care was time spent personally by me on the following activities: development of treatment plan with patient and/or surrogate as well as nursing, discussions with consultants, evaluation of patient's response to treatment, examination of patient, obtaining history from patient or surrogate, ordering and performing treatments and interventions, ordering and review of laboratory studies, ordering and review of radiographic studies, pulse oximetry and re-evaluation of patient's condition.   Labs Reviewed  CBC - Abnormal; Notable for the following:    Hemoglobin 11.8 (*)     HCT 35.3 (*)     All other components within normal limits  COMPREHENSIVE METABOLIC PANEL - Abnormal; Notable for the following:    Potassium 3.4 (*)     Alkaline Phosphatase 32 (*)     Total Bilirubin 0.2 (*)     All other components within normal limits  ACETAMINOPHEN LEVEL - Abnormal; Notable for the following:    Acetaminophen (Tylenol), Serum 48.2 (*)     All other components within normal limits  SALICYLATE LEVEL - Abnormal; Notable for the following:    Salicylate Lvl <2.0 (*)     All other components within normal limits  URINE RAPID DRUG SCREEN (HOSP PERFORMED) - Abnormal; Notable for the following:    Tetrahydrocannabinol POSITIVE (*)     All other components within normal limits  URINALYSIS,  ROUTINE W REFLEX MICROSCOPIC - Abnormal; Notable for the following:    APPearance CLOUDY (*)     Specific Gravity, Urine 1.031 (*)     Nitrite POSITIVE (*)     All other components within normal limits  ACETAMINOPHEN LEVEL - Abnormal; Notable for the following:    Acetaminophen (Tylenol), Serum 37.1 (*)     All other components within normal limits  URINE MICROSCOPIC-ADD ON - Abnormal; Notable for the following:    Bacteria, UA MANY (*)     All other components within normal limits  ETHANOL  PREGNANCY, URINE   No results found.   Date: 12/14/2011  Rate: 77  Rhythm: normal sinus rhythm  QRS Axis: normal  Intervals: normal  ST/T Wave abnormalities: nonspecific T wave changes  Conduction Disutrbances:none  Narrative Interpretation:   Old EKG Reviewed:  unchanged    1. Overdose       MDM  25 year old female with concern for overdose. Initial Tylenol level elevated, will need for our level. Patient has been sleeping. Her airway is remaining protected. Her EKG does not show any QRS or widening. She has not had any seizures or psychomotor agitation since her initial presentation. She may require Ativan if she worsens with these symptoms.   Patient woke well in the morning, now demanding to go home. Patient reports "it's none of your business if I want to off myself". I am concerned about her intentions last night and taking 14 Tylenol PM. Her repeat Tylenol level showed a decreasing level. I've placed IVC paperwork on her chart. Patient will prior psychiatric evaluation for possible suicide attempt.       Olivia Mackie, MD 12/14/11 1027

## 2011-12-14 NOTE — ED Notes (Signed)
BJY:NWGN<FA> Expected date:12/13/11<BR> Expected time:11:50 PM<BR> Means of arrival:Ambulance<BR> Comments:<BR> Overdose  Tylenol and benadryl

## 2011-12-14 NOTE — ED Notes (Signed)
Tele-psy been done, waiting for result to be fax back

## 2011-12-15 ENCOUNTER — Inpatient Hospital Stay (HOSPITAL_COMMUNITY)
Admission: AD | Admit: 2011-12-15 | Discharge: 2011-12-16 | DRG: 430 | Disposition: A | Discharge status: Home / Self Care | Payer: BC Managed Care – PPO | Source: Ambulatory Visit | Attending: Psychiatry | Admitting: Psychiatry

## 2011-12-15 ENCOUNTER — Encounter (HOSPITAL_COMMUNITY): Payer: Self-pay

## 2011-12-15 DIAGNOSIS — F1994 Other psychoactive substance use, unspecified with psychoactive substance-induced mood disorder: Secondary | ICD-10-CM | POA: Diagnosis present

## 2011-12-15 DIAGNOSIS — F1210 Cannabis abuse, uncomplicated: Secondary | ICD-10-CM | POA: Diagnosis present

## 2011-12-15 DIAGNOSIS — F1290 Cannabis use, unspecified, uncomplicated: Secondary | ICD-10-CM

## 2011-12-15 DIAGNOSIS — F329 Major depressive disorder, single episode, unspecified: Principal | ICD-10-CM | POA: Diagnosis present

## 2011-12-15 DIAGNOSIS — F17200 Nicotine dependence, unspecified, uncomplicated: Secondary | ICD-10-CM | POA: Diagnosis present

## 2011-12-15 MED ORDER — ACETAMINOPHEN 325 MG PO TABS: 650.0000 mg | ORAL_TABLET | Freq: Four times a day (QID) | ORAL | Status: DC | PRN

## 2011-12-15 MED ORDER — CIPROFLOXACIN HCL 500 MG PO TABS: 500.0000 mg | ORAL_TABLET | Freq: Two times a day (BID) | ORAL | Status: DC

## 2011-12-15 MED ORDER — MAGNESIUM HYDROXIDE 400 MG/5ML PO SUSP: 30.0000 mL | Freq: Every day | ORAL | Status: DC | PRN

## 2011-12-15 MED ORDER — HYDROXYZINE HCL 50 MG PO TABS
50.0000 mg | ORAL_TABLET | Freq: Every evening | ORAL | Status: DC | PRN
  Administered 2011-12-15 (×2): 50 mg via ORAL
  Filled 2011-12-15 (×6): qty 1

## 2011-12-15 MED ORDER — NICOTINE 21 MG/24HR TD PT24
21.0000 mg | MEDICATED_PATCH | Freq: Every day | TRANSDERMAL | Status: DC
  Administered 2011-12-16: 21 mg via TRANSDERMAL
  Filled 2011-12-15 (×3): qty 1

## 2011-12-15 MED ORDER — ALUM & MAG HYDROXIDE-SIMETH 200-200-20 MG/5ML PO SUSP: 30.0000 mL | ORAL | Status: DC | PRN

## 2011-12-15 MED ORDER — CIPROFLOXACIN HCL 500 MG PO TABS
500.0000 mg | ORAL_TABLET | Freq: Once | ORAL | Status: AC
  Administered 2011-12-15: 500 mg via ORAL
  Filled 2011-12-15: qty 1

## 2011-12-15 NOTE — Progress Notes (Signed)
Pt's mother will be bringing an attorney tomorrow to speak with Administration here. They feel like pt has been wrongfully hospitalized.

## 2011-12-15 NOTE — Tx Team (Signed)
Initial Interdisciplinary Treatment Plan  PATIENT STRENGTHS: (choose at least two) Ability for insight Average or above average intelligence Communication skills Supportive family/friends Work skills  PATIENT STRESSORS: NONE   PROBLEM LIST: Problem List/Patient Goals Date to be addressed Date deferred Reason deferred Estimated date of resolution                                                         DISCHARGE CRITERIA:  Improved stabilization in mood, thinking, and/or behavior  PRELIMINARY DISCHARGE PLAN: Outpatient therapy  PATIENT/FAMIILY INVOLVEMENT: This treatment plan has been presented to and reviewed with the patient, DWAN FENNEL, and/or family member.  The patient and family have been given the opportunity to ask questions and make suggestions.  Gretta Arab [facility] 12/15/2011, 8:09 PM

## 2011-12-15 NOTE — BH Assessment (Addendum)
Assessment Note  Therapist re-assessed patient in order to change disposition for placement.  Patient is now IVC due to the recommendations of inpatient from the Telepsych.  Patient continues to be under a lot of stress and has requested to have outpatient referrals.  Patient reports that she still needs for everyone to leave her alone and she just wants to go to sleep in her own bed. Patient reports that she is able to contract for safety.  Therapist consulted with the ER MD regarding referring the patient to [facility]   Anna Gregory is an 26 y.o. female that presented to [facility] post overdose.  Pt states that she cannot remember the events of last night or how she got to the ED, but "I know I didn't call 911."  Pt reports living with her mother and being under a lot of stress, including financial and interpersonal, though she will not elaborate "It's too much to get into."  Pt voices not sleeping well "only 3 1/2- 4 hours a night" so she admits taking Tylenol PM in an attempt to sleep.  Pt does admit feelings "stressed and overwhelmed about everything" but denies current active SI "I wanted to sleep and I wanted everyone to leave me alone."  Pt denies any prior psychiatric treatment or any previous suicide attempts or hospitalizations.  Pt does voice "seeing someone when I was younger for anger problems, but I can't remember when or who."  Pt denies any substance abuse though she did test + for Cannibus.  Pt denies current thoughts of harm to herself or others and states "I just want to go home and rest."  Pt denies HI or any active psychosis.  Pt was placed on involuntary commitment last night d/t agitation and refusing to complete a thorough examination/contract for safety.  Writer feels that pt would benefit from a Telespych consultation to determine if she can be released with mental health referrals.  Writer relayed information to Geographical information systems officer and RN and order will be completed.  Awaiting Telepsych  results to determine patient needs for inpatient versus outpatient care.    Axis I: Rule out Major Depression Axis II: Deferred Axis III:  Past Medical History  Diagnosis Date  . PONV (postoperative nausea and vomiting)   . Depression   . Urinary tract infection   . Chlamydia   . Trichomonas    Axis IV: economic problems, occupational problems, other psychosocial or environmental problems and problems related to social environment Axis V: 31-40 impairment in reality testing  Past Medical History:  Past Medical History  Diagnosis Date  . PONV (postoperative nausea and vomiting)   . Depression   . Urinary tract infection   . Chlamydia   . Trichomonas     Past Surgical History  Procedure Date  . Cesarean section   . Tubal ligation 01/10/2011    Procedure: POST PARTUM TUBAL LIGATION;  Surgeon: Catalina Antigua, MD;  Location: [facility];  Service: Gynecology;  Laterality: Bilateral;  Bilateral post partum tubal ligation.    Family History:  Family History  Problem Relation Age of Onset  . Hypertension Maternal Grandmother   . Diabetes Paternal Grandfather     Social History:  reports that she has been smoking.  She has never used smokeless tobacco. She reports that she does not drink alcohol or use illicit drugs.  Additional Social History:  Alcohol / Drug Use Pain Medications: See MAR Prescriptions: See MAR Over the Counter: See MAR History of alcohol /  drug use?: Yes (Pt denies hx of substance abuse but she is + for Cannibus) Substance #1 Name of Substance 1: Cannibus 1 - Age of First Use: unknown- pt won't say 1 - Amount (size/oz): unknown- pt won't say 1 - Frequency: unknown- pt would not admit to use 1 - Duration: unknown 1 - Last Use / Amount: unknown  CIWA: CIWA-Ar BP: 116/66 mmHg Pulse Rate: 65  COWS:    Allergies:  Allergies  Allergen Reactions  . Strawberry Anaphylaxis and Other (See Comments)    Reaction: throat swelling   . Aspirin Hives    Pt can  take ibuprofen    Home Medications:  (Not in a hospital admission)  OB/GYN Status:  Patient's last menstrual period was 11/04/2011.  General Assessment Data Location of Assessment: [facility] ED ACT Assessment: Yes Living Arrangements: Parent Can pt return to current living arrangement?: Yes Admission Status: Involuntary Is patient capable of signing voluntary admission?: No Transfer from: Acute Hospital Referral Source: MD  Education Status Is patient currently in school?: No  Risk to self Suicidal Ideation: No Suicidal Intent: No Is patient at risk for suicide?: Yes Suicidal Plan?: No Access to Means: Yes Specify Access to Suicidal Means: pills and sharps available when not in ED What has been your use of drugs/alcohol within the last 12 months?: Pt denies abuse of substances but was + for Cannibus Previous Attempts/Gestures: No How many times?: 0  Other Self Harm Risks: impulsive Triggers for Past Attempts: Other (Comment) (pt denies previous attempts) Intentional Self Injurious Behavior: Damaging Comment - Self Injurious Behavior: pt took 10 Tylenol PM, reportedly for "sleep" Family Suicide History: No Recent stressful life event(s): Turmoil (Comment);Conflict (Comment);Financial Problems Persecutory voices/beliefs?: No Depression: Yes Depression Symptoms: Insomnia;Feeling worthless/self pity;Feeling angry/irritable Substance abuse history and/or treatment for substance abuse?: Yes Suicide prevention information given to non-admitted patients: Not applicable  Risk to Others Homicidal Ideation: No Thoughts of Harm to Others: No Current Homicidal Intent: No Current Homicidal Plan: No Access to Homicidal Means: No Identified Victim: none per pt History of harm to others?: No Assessment of Violence: In distant past Violent Behavior Description: 'I used to have anger issues" Does patient have access to weapons?: No Criminal Charges Pending?: No Does patient have a court  date: No  Psychosis Hallucinations: None noted Delusions: None noted  Mental Status Report Appear/Hygiene: Disheveled Eye Contact: Fair Motor Activity: Unremarkable Speech: Soft;Slow;Logical/coherent Level of Consciousness: Quiet/awake Mood: Depressed;Anxious;Suspicious;Apprehensive;Sullen Affect: Anxious;Apprehensive;Appropriate to circumstance Anxiety Level: Minimal Thought Processes: Relevant Judgement: Impaired Orientation: Person;Place;Time Obsessive Compulsive Thoughts/Behaviors: None  Cognitive Functioning Concentration: Decreased Memory: Recent Impaired;Remote Intact IQ: Average Insight: Fair Impulse Control: Poor Appetite: Fair Weight Loss: 0  Weight Gain: 0  Sleep: Decreased Total Hours of Sleep:  ( 3 1/2 - 4) Vegetative Symptoms: None  ADLScreening [facility] Assessment Services) Patient's cognitive ability adequate to safely complete daily activities?: Yes Patient able to express need for assistance with ADLs?: Yes Independently performs ADLs?: Yes (appropriate for developmental age)  Abuse/Neglect [facility]) Physical Abuse: Denies (Mother and "others") Verbal Abuse: Denies Sexual Abuse: Denies  Prior Inpatient Therapy Prior Inpatient Therapy: No Prior Therapy Dates: none per pt Prior Therapy Facilty/Provider(s): none per pt Reason for Treatment: none per pt  Prior Outpatient Therapy Prior Outpatient Therapy: No Prior Therapy Dates: n/a Prior Therapy Facilty/Provider(s): no Reason for Treatment: n/a  ADL Screening (condition at time of admission) Patient's cognitive ability adequate to safely complete daily activities?: Yes Patient able to express need for assistance with ADLs?: Yes  Independently performs ADLs?: Yes (appropriate for developmental age)       Abuse/Neglect Assessment (Assessment to be complete while patient is alone) Physical Abuse: Denies (Mother and "others") Verbal Abuse: Denies Sexual Abuse: Denies Exploitation of  patient/patient's resources: Denies Self-Neglect: Denies Values / Beliefs Cultural Requests During Hospitalization: None Spiritual Requests During Hospitalization: None   Advance Directives (For Healthcare) Advance Directive: Patient does not have advance directive Nutrition Screen- [facility] Adult/[facility] Patient's home diet: Regular  Additional Information 1:1 In Past 12 Months?: No CIRT Risk: No Elopement Risk: No Does patient have medical clearance?: Yes     Disposition: Telepsych recommends inpatient; Pending [facility]  Disposition Disposition of Patient: Referred to Patient referred to: Other (Comment) (requested Telepsych consult to determine placement needs.)  On Site Evaluation by:   Reviewed with Physician:     Phillip Heal LaVerne 12/15/2011 6:10 AM

## 2011-12-15 NOTE — BHH Counselor (Signed)
Accepted to [facility] by Dr. Dan Humphreys to Dr. Dan Humphreys [phone]). Completed support documentation. Pt is IVC and to be transported via [employer]. Updated RN & EDP.

## 2011-12-15 NOTE — Progress Notes (Addendum)
D:Patient in bed; not sleeping at the beginning of this shift. She reported having difficulty falling asleep and that her room mate snores very loudly. Her mood and affect appropriate and she seemed very pleasant. A: Writer invited patient to the medication window and checked patient medication orders to see if she had anything to help her fall asleep. Pt had already received 50 mg of Vistaril and a repeat dose of another 50 mg. Writer asked patient if she would like to sleep in the quiet room, since the room mate was snoring. Writer not comfortable ordering another sleep medication at this time based on patient history. Will notify physician if patient still not asleep in the next hour. Patient encouraged and supported. R: Patient stated that she would rather stay in her room at this time, unwilling to sleep in the quiet room. She had ear plugs on.    UPDATE: 0015: Checked on patient. Patient has falling asleep. Resting quietly with eyes closed. Respirations even and unlabored. No distress noted. Q 15 minute check continues to maintain safety.

## 2011-12-15 NOTE — ED Notes (Signed)
Report received from [place], [place]. Pt resting comfortably. No c/o. Sitter present.

## 2011-12-15 NOTE — ED Notes (Signed)
Pt talking on phone. Calm, cooperative. Lunch tray provided.

## 2011-12-15 NOTE — Progress Notes (Signed)
Patient ID: Anna Gregory, female   DOB: December 05, 1985, 26 y.o.   MRN: [id number] Pt admitted IVC today for suicide attempt by ingesting 14 Tylenol PM. Pt states that she took 14 generic Tylenol PM over the course of 7 hours to sleep. Pt was found unresponsive by her mother and EMS called. 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 Pt tearful and irritated on admission because she feels that she is only IVC because the Dr at the hospital attempted to "rip" her piercing out of her face, at which time the pt responded with profanity. Pt is upset because her birthday is in 4 days and she has already paid $1500 for a party. Pt states that she was told at ED that after 72 hours of IVC she would then be released home. She is upset because she feels that she was given misinformation. Pt is also concerned that she will lose her job. Pt has 2 small children and lives with her mother. Pt is a Interior and spatial designer. This is pt's first psych admission. Pt denies SI/HI/AVH.

## 2011-12-15 NOTE — ED Notes (Signed)
Pt's mother at bedside w/ pt after being wanded by security. No c/o at present.

## 2011-12-15 NOTE — ED Provider Notes (Signed)
Patient is sleeping this morning  Filed Vitals:   12/15/11 0620  BP: 112/73  Pulse: 67  Temp: 97.8 F (36.6 C)  Resp: 18   Awaiting psychiatric disposition.  Tylenol level is declining. Patient is medically stable and cleared  Celene Kras, MD 12/15/11 [phone]

## 2011-12-15 NOTE — ED Notes (Signed)
Report called to Inetta Fermo, RN at [facility]. Pt aware of transfer this evening.

## 2011-12-16 DIAGNOSIS — F1210 Cannabis abuse, uncomplicated: Secondary | ICD-10-CM

## 2011-12-16 DIAGNOSIS — F329 Major depressive disorder, single episode, unspecified: Secondary | ICD-10-CM | POA: Diagnosis present

## 2011-12-16 DIAGNOSIS — F32A Depression, unspecified: Secondary | ICD-10-CM | POA: Diagnosis present

## 2011-12-16 DIAGNOSIS — F1290 Cannabis use, unspecified, uncomplicated: Secondary | ICD-10-CM | POA: Diagnosis present

## 2011-12-16 MED ORDER — CITALOPRAM HYDROBROMIDE 20 MG PO TABS
10.0000 mg | ORAL_TABLET | Freq: Every day | ORAL | Status: DC
  Filled 2011-12-16 (×2): qty 1

## 2011-12-16 MED ORDER — NICOTINE 21 MG/24HR TD PT24: 1.0000 | MEDICATED_PATCH | Freq: Every day | TRANSDERMAL | Status: DC

## 2011-12-16 MED ORDER — CIPROFLOXACIN HCL 500 MG PO TABS
500.0000 mg | ORAL_TABLET | Freq: Two times a day (BID) | ORAL | Status: DC
  Administered 2011-12-16: 500 mg via ORAL
  Filled 2011-12-16 (×5): qty 1

## 2011-12-16 MED ORDER — CIPROFLOXACIN HCL 500 MG PO TABS: 500.0000 mg | ORAL_TABLET | Freq: Two times a day (BID) | ORAL | Status: DC

## 2011-12-16 MED ORDER — OMEPRAZOLE 20 MG PO CPDR: 20.0000 mg | DELAYED_RELEASE_CAPSULE | Freq: Every day | ORAL | Status: DC

## 2011-12-16 MED ORDER — HYDROXYZINE HCL 50 MG PO TABS: 50.0000 mg | ORAL_TABLET | Freq: Every evening | ORAL | Status: DC | PRN
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
## 2011-12-16 NOTE — [facility] Suicide Risk Assessment (Signed)
Suicide Risk Assessment  Admission Assessment     See Assessment at Discharge on this date.  Anna Gregory 12/16/2011, 4:43 PM

## 2011-12-16 NOTE — H&P (Signed)
Psychiatric Admission Assessment Adult  Patient Identification:  Anna Gregory  Date of Evaluation:  12/16/2011  Chief Complaint:  MDD  History of Present Illness: This a 26 year old African-American female, admitted to [facility] from the [facility] ED with reports of suspected suicide attempt by overdose on tylenol pm. Patient reports, "I was taken to the [facility] ED last Tuesday night by the EMS. What happened was, I had taken 14 tablets of Tylenol PM to help me sleep and also help my left wrist because it was hurting. 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 So, it always hurts, and at night time, it keeps from sleeping well. This OTC medicine is also generic. It was not so effective. I keeping taking the pills, 2 tablets at time to see if I will fall asleep. Then it all came down on me one time. I fell hard asleep. I was actually out of it. My mother saw me, probably had hard time waking me up. She panicked and called 911. I stayed in the hospital x 2 days.  The reason for the 2 days was because, they found too much tylenol in my system after they ran a blood test. Then everyone start to asking questions about how long have you been thinking and or planning to hurt myself and why. I told them that I was not suicidal, I have 2 beautiful children that I love. I was in a lot of stress, and who is not. That is not a reason to kill myself. The reason for the IVC was because I called a nurse a b.... word. But she ordered me to remove my lip piecing, I told her that it is not removal, but she ignored me and tried to yank it off my lip, it hurts so much that I got mad and called her the bad name. Then every one of them start to say that I was unstable, even though she drove me to calling her the ugly name".  ROS Per ED provider. HENT:  Head: Normocephalic and atraumatic.  Right Ear: External ear normal.  Left Ear: External ear normal.    Nose: Nose normal.  Mouth/Throat: Oropharynx is clear and moist.  Facial piercings noted  Eyes: Right eye exhibits no discharge. Left eye exhibits no discharge. No scleral icterus.  Pupils are dilated, she is noted to have nystagmus.  Neck: Normal range of motion. Neck supple. No JVD present. No tracheal deviation present. No thyromegaly present.  Cardiovascular: Normal rate, regular rhythm, normal heart sounds and intact distal pulses. Exam reveals no gallop and no friction rub.  No murmur heard.  Pulmonary/Chest: Effort normal and breath sounds normal. No stridor. No respiratory distress. She has no wheezes. She has no rales. She exhibits no tenderness.  Abdominal: Soft. Bowel sounds are normal. She exhibits no distension and no mass. There is no tenderness. There is no rebound and no guarding.  Musculoskeletal: Normal range of motion. She exhibits no edema and no tenderness.  Lymphadenopathy:  She has no cervical adenopathy.  Neurological: She exhibits normal muscle tone. Coordination normal.  She will not follow commands consistently, but appears to have fairly normal neuro exam aside from nystagmus and slight tremors with fine movements  Skin: Skin is warm and dry. No rash noted. No erythema. No pallor.  Psychiatric:     Mood Symptoms:  "I don't feel depressed"  Depression Symptoms:  insomnia,  (  Hypo) Manic Symptoms:  Irritable Mood,sometimes.  Anxiety Symptoms:  Excessive Worry,  Psychotic Symptoms:  Hallucinations: None  PTSD Symptoms: Had a traumatic exposure:  Denies any traumatic events.  Past Psychiatric History: Diagnosis: Cannabis use, uncomplicated  Hospitalizations: [facility]  Outpatient Care: [facility] physicians  Substance Abuse Care: None reported  Self-Mutilation: None reported  Suicidal Attempts: Denies attempts and thoughts  Violent Behaviors: None reported   Past Medical History:   Past Medical History  Diagnosis Date  . PONV (postoperative nausea and  vomiting)   . Depression   . Urinary tract infection   . Chlamydia   . Trichomonas      Allergies:   Allergies  Allergen Reactions  . Strawberry Anaphylaxis and Other (See Comments)    Reaction: throat swelling   . Aspirin Hives    Pt can take ibuprofen   PTA Medications: Prescriptions prior to admission  Medication Sig Dispense Refill  . ibuprofen (ADVIL,MOTRIN) 600 MG tablet Take 600 mg by mouth every 6 (six) hours as needed. For pain       Substance Abuse History in the last 12 months: Substance Age of 1st Use Last Use Amount Specific Type  Nicotine 16 Prior to hosp 7 cigarettes daily Cigarettes  Alcohol 21 "I drink occasionally" 1 glass of wine daily Wine  Cannabis Denies use of any other drugs and or substances.     Opiates      Cocaine      Methamphetamines      LSD      Ecstasy      Benzodiazepines      Caffeine      Inhalants      Others:                          Consequences of Substance Abuse: Medical Consequences:  Liver damage, Possible death by overdose Legal Consequences:  Arrests, jail time, Loss of driving privilege. Family Consequences:  Family discord, divorce and or separation.    Social History: Current Place of Residence: [place],   Scientist, research (physical sciences) of Birth:  [place]  Family Members: "I have 2 daughters"  Marital Status:  Single  Children: 2  Sons:0  Daughters: 2  Relationships: Single  Education:  [employer] planner Problems/Performance: Completed high school  Religious Beliefs/Practices: None reported  History of Abuse (Emotional/Phsycial/Sexual): None rported  Occupational Experiences: Self employed Mining engineer)  Military History:  None.  Legal History: None reported  Hobbies/Interests: None reported  Family History:   Family History  Problem Relation Age of Onset  . Hypertension Maternal Grandmother   . Diabetes Paternal Grandfather     Mental Status Examination/Evaluation: Objective:  Appearance: Casual,  bodily piecings , and tattoos  Eye Contact::  Good  Speech:  Clear and Coherent  Volume:  Normal  Mood:  Euthymic  Affect:  Appropriate  Thought Process:  Coherent and Intact  Orientation:  Full  Thought Content:  Denies halluicnations, delusions and paranoia  Suicidal Thoughts:  No  Homicidal Thoughts:  No  Memory:  Immediate;   Good Recent;   Good Remote;   Good  Judgement:  Good  Insight:  Good  Psychomotor Activity:  Normal  Concentration:  Good  Recall:  Good  Akathisia:  No  Handed:  Right  AIMS (if indicated):     Assets:  Desire for Improvement  Sleep:  Number of Hours: 5.25     Laboratory/X-Ray: None Psychological Evaluation(s)  Assessment:    AXIS I:  Cannabis use, uncomplicated AXIS II:  Deferred AXIS III:   Past Medical History  Diagnosis Date  . PONV (postoperative nausea and vomiting)   . Depression   . Urinary tract infection   . Chlamydia   . Trichomonas    AXIS IV:  other psychosocial or environmental problems AXIS V:  11-20 some danger of hurting self or others possible OR occasionally fails to maintain minimal personal hygiene OR gross impairment in communication  Treatment Plan/Recommendations: Admit for safety and stabilization. Review and reinstate any pertinent home medications for other health issues. Continue current treatment plan already in progress. Group counseling sessions.   Treatment Plan Summary: Daily contact with patient to assess and evaluate symptoms and progress in treatment Medication management  Current Medications:  Current Facility-Administered Medications  Medication Dose Route Frequency Provider Last Rate Last Dose  . acetaminophen (TYLENOL) tablet 650 mg  650 mg Oral Q6H PRN Mickie D. Adams, PA      . alum & mag hydroxide-simeth (MAALOX/MYLANTA) 200-200-20 MG/5ML suspension 30 mL  30 mL Oral Q4H PRN Mickie D. Adams, PA      . ciprofloxacin (CIPRO) tablet 500 mg  500 mg Oral BID Sanjuana Kava, NP   500 mg at  12/16/11 1158  . citalopram (CELEXA) tablet 10 mg  10 mg Oral Daily Mike Craze, MD      . hydrOXYzine (ATARAX/VISTARIL) tablet 50 mg  50 mg Oral QHS,MR X 1 Mickie D. Adams, PA   50 mg at 12/15/11 2206  . magnesium hydroxide (MILK OF MAGNESIA) suspension 30 mL  30 mL Oral Daily PRN Mickie D. Adams, PA      . nicotine (NICODERM CQ - dosed in mg/24 hours) patch 21 mg  21 mg Transdermal Q0600 Mickie D. Adams, PA   21 mg at 12/16/11 1610   Facility-Administered Medications Ordered in Other Encounters  Medication Dose Route Frequency Provider Last Rate Last Dose  . ciprofloxacin (CIPRO) tablet 500 mg  500 mg Oral Once Raeford Razor, MD   500 mg at 12/15/11 1722  . DISCONTD: alum & mag hydroxide-simeth (MAALOX/MYLANTA) 200-200-20 MG/5ML suspension 30 mL  30 mL Oral PRN Olivia Mackie, MD      . DISCONTD: ciprofloxacin (CIPRO) tablet 500 mg  500 mg Oral BID Raeford Razor, MD      . DISCONTD: LORazepam (ATIVAN) tablet 2 mg  2 mg Oral Q6H PRN Gwyneth Sprout, MD   2 mg at 12/14/11 2103  . DISCONTD: nicotine (NICODERM CQ - dosed in mg/24 hours) patch 21 mg  21 mg Transdermal Daily Olivia Mackie, MD   21 mg at 12/15/11 0930  . DISCONTD: ondansetron (ZOFRAN) tablet 4 mg  4 mg Oral Q8H PRN Olivia Mackie, MD      . DISCONTD: zolpidem (AMBIEN) tablet 10 mg  10 mg Oral QHS PRN Gwyneth Sprout, MD   10 mg at 12/14/11 2334    Observation Level/Precautions:  Q 15 minute checks for safety.  Laboratory:  Reviewed ED lab findings on file  Psychotherapy:  Group  Medications: See medication  Routine PRN Medications:  Yes  Consultations:  None indicated  Discharge Concerns: Safety   Other:     Armandina Stammer I 9/26/20134:30 PM

## 2011-12-16 NOTE — [facility] Suicide Risk Assessment (Signed)
Suicide Risk Assessment  Discharge Assessment     Current Mental Status by Physician: Patient denies suicidal or homicidal ideation, hallucinations, illusions, or delusions. Patient engages with good eye contact, is able to focus adequately in a one to one setting, and has clear goal directed thoughts. Patient speaks with a natural conversational volume, rate, and tone. Anxiety was reported at 3 on a scale of 1 the least and 10 the most. Depression was reported at 3 on the same scale. Patient is oriented times 4, recent and remote memory intact. Judgement: improved from admission Insight: improved from admission  Demographic factors:    Loss Factors:    Historical Factors:    Risk Reduction Factors:  Responsible for children under 73 years of age;Sense of responsibility to family;Employed;Living with another person, especially a relative;Positive social support  Continued Clinical Symptoms:  Severe Anxiety and/or Agitation Depression:   Anhedonia Comorbid alcohol abuse/dependence Insomnia Alcohol/Substance Abuse/Dependencies Chronic Pain  Discharge Diagnoses: AXIS I:  Major Depression, single episode, Substance Induced Mood Disorder and Cannabis and Nicotine Dependence AXIS II:  Deferred AXIS III:   Past Medical History  Diagnosis Date  . PONV (postoperative nausea and vomiting)   . Depression   . Urinary tract infection   . Chlamydia   . Trichomonas    AXIS IV:  other psychosocial or environmental problems AXIS V:  51-60 moderate symptoms  Cognitive Features That Contribute To Risk:  Thought constriction (tunnel vision)    Suicide Risk:  Minimal: No identifiable suicidal ideation.  Patients presenting with no risk factors but with morbid ruminations; may be classified as minimal risk based on the severity of the depressive symptoms  Labs:  Results for orders placed during the hospital encounter of 12/14/11 (from the past 72 hour(s))  GLUCOSE, CAPILLARY     Status:  Normal   Collection Time   12/14/11 12:30 AM      Component Value Range Comment   Glucose-Capillary 86  70 - 99 mg/dL    Comment 1 Orig Pt Id entered as [id number]     CBC     Status: Abnormal   Collection Time   12/14/11 12:35 AM      Component Value Range Comment   WBC 9.5  4.0 - 10.5 K/uL    RBC 4.02  3.87 - 5.11 MIL/uL    Hemoglobin 11.8 (*) 12.0 - 15.0 g/dL    HCT 47.8 (*) 29.5 - 46.0 %    MCV 87.8  78.0 - 100.0 fL    MCH 29.4  26.0 - 34.0 pg    MCHC 33.4  30.0 - 36.0 g/dL    RDW 62.1  30.8 - 65.7 %    Platelets 234  150 - 400 K/uL   COMPREHENSIVE METABOLIC PANEL     Status: Abnormal   Collection Time   12/14/11 12:35 AM      Component Value Range Comment   Sodium 138  135 - 145 mEq/L    Potassium 3.4 (*) 3.5 - 5.1 mEq/L    Chloride 103  96 - 112 mEq/L    CO2 25  19 - 32 mEq/L    Glucose, Bld 82  70 - 99 mg/dL    BUN 7  6 - 23 mg/dL    Creatinine, Ser 8.46  0.50 - 1.10 mg/dL    Calcium 9.1  8.4 - 96.2 mg/dL    Total Protein 6.8  6.0 - 8.3 g/dL    Albumin 3.6  3.5 - 5.2 g/dL  AST 11  0 - 37 U/L    ALT 12  0 - 35 U/L    Alkaline Phosphatase 32 (*) 39 - 117 U/L    Total Bilirubin 0.2 (*) 0.3 - 1.2 mg/dL    GFR calc non Af Amer >90  >90 mL/min    GFR calc Af Amer >90  >90 mL/min   ETHANOL     Status: Normal   Collection Time   12/14/11 12:35 AM      Component Value Range Comment   Alcohol, Ethyl (B) <11  0 - 11 mg/dL   ACETAMINOPHEN LEVEL     Status: Abnormal   Collection Time   12/14/11 12:35 AM      Component Value Range Comment   Acetaminophen (Tylenol), Serum 48.2 (*) 10 - 30 ug/mL   SALICYLATE LEVEL     Status: Abnormal   Collection Time   12/14/11 12:35 AM      Component Value Range Comment   Salicylate Lvl <2.0 (*) 2.8 - 20.0 mg/dL   URINE RAPID DRUG SCREEN (HOSP PERFORMED)     Status: Abnormal   Collection Time   12/14/11  1:07 AM      Component Value Range Comment   Opiates NONE DETECTED  NONE DETECTED    Cocaine NONE DETECTED  NONE DETECTED     Benzodiazepines NONE DETECTED  NONE DETECTED    Amphetamines NONE DETECTED  NONE DETECTED    Tetrahydrocannabinol POSITIVE (*) NONE DETECTED    Barbiturates NONE DETECTED  NONE DETECTED   PREGNANCY, URINE     Status: Normal   Collection Time   12/14/11  1:07 AM      Component Value Range Comment   Preg Test, Ur NEGATIVE  NEGATIVE   URINALYSIS, ROUTINE W REFLEX MICROSCOPIC     Status: Abnormal   Collection Time   12/14/11  1:07 AM      Component Value Range Comment   Color, Urine YELLOW  YELLOW    APPearance CLOUDY (*) CLEAR    Specific Gravity, Urine 1.031 (*) 1.005 - 1.030    pH 6.5  5.0 - 8.0    Glucose, UA NEGATIVE  NEGATIVE mg/dL    Hgb urine dipstick NEGATIVE  NEGATIVE    Bilirubin Urine NEGATIVE  NEGATIVE    Ketones, ur NEGATIVE  NEGATIVE mg/dL    Protein, ur NEGATIVE  NEGATIVE mg/dL    Urobilinogen, UA 1.0  0.0 - 1.0 mg/dL    Nitrite POSITIVE (*) NEGATIVE    Leukocytes, UA NEGATIVE  NEGATIVE   URINE MICROSCOPIC-ADD ON     Status: Abnormal   Collection Time   12/14/11  1:07 AM      Component Value Range Comment   Squamous Epithelial / LPF RARE  RARE    WBC, UA 3-6  <3 WBC/hpf    Bacteria, UA MANY (*) RARE    Urine-Other MUCOUS PRESENT     ACETAMINOPHEN LEVEL     Status: Abnormal   Collection Time   12/14/11  3:00 AM      Component Value Range Comment   Acetaminophen (Tylenol), Serum 37.1 (*) 10 - 30 ug/mL    RISK REDUCTION FACTORS: What pt has learned from hospital stay is to not take so many pills without asking for some other kind of help, and that they have poor time management.  Risk of self harm is elevated by their depression and their use of addictive substances.  Risk of harm to others is minimal in  that she has not been involved in fights or had any legal charges filed on her.  Pt seen in treatment team where she divulged the above information. The treatment team concluded that she was ready for discharge and had met her goals for an inpatient  setting.  PLAN: Discharge home Continue   Medication List     As of 12/16/2011  4:21 PM    TAKE these medications      Indication    ciprofloxacin 500 MG tablet   Commonly known as: CIPRO   Take 1 tablet (500 mg total) by mouth 2 (two) times daily. For infection       hydrOXYzine 50 MG tablet   Commonly known as: ATARAX/VISTARIL   Take 1 tablet (50 mg total) by mouth at bedtime and may repeat dose one time if needed. For insomnia.       ibuprofen 600 MG tablet   Commonly known as: ADVIL,MOTRIN   Take 600 mg by mouth every 6 (six) hours as needed. For pain       nicotine 21 mg/24hr patch   Commonly known as: NICODERM CQ - dosed in mg/24 hours   Place 1 patch onto the skin daily at 6 (six) AM. For smoking cessation.       omeprazole 20 MG capsule   Commonly known as: PRILOSEC   Take 1 capsule (20 mg total) by mouth daily. Take one or two a day with regular Ibuprofen for wrist healing        Follow-up recommendations:  Activities: Resume typical activities Diet: Resume typical diet Tests: Follow up on the urinanalysis to be sure that the Cipro worked. Other: Follow up with outpatient provider and report any side effects to out patient prescriber.  Dan Humphreys, Kwane Rohl 12/16/2011 4:21 PM

## 2011-12-16 NOTE — Progress Notes (Signed)
Pt discharged per MD orders; pt currently denies SI/HI and auditory/visual hallucinations; pt was given education by RN regarding follow-up appointments and medications and pt denied any questions or concerns about these instructions; pt was then escorted to search room to retrieve her belongings by RN before being discharged to hospital lobby. 

## 2011-12-16 NOTE — Progress Notes (Signed)
        [facility] Group Notes: (Counselor/Nursing/MHT/Case Management/Adjunct) 12/16/2011   @1 :15pm [employer] [facility]  Type of Therapy:  Group Therapy  Participation Level:  Minimal  Participation Quality:  Drowsy  Affect:  Appropriate  Cognitive:  Appropriate  Insight:  Minimal  Engagement in Group:  Minimal  Engagement in Therapy:  None  Modes of Intervention:  Support and Exploration  Summary of Progress/Problems:  Charlisa participated with speaker from [employer] [facility] [place] in discussion of personal strengths. 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 Sidonie then fell asleep while speaker was telling her story.   Billie Lade 12/16/2011  2:41 PM

## 2011-12-16 NOTE — Progress Notes (Signed)
Psychoeducational Group Note  Date:  12/16/2011 Time:  10:00  Group Topic/Focus:  Gratitude Journaling  Participation Level:  Active  Participation Quality:  Appropriate, Attentive and Sharing  Affect:  Appropriate and Flat  Cognitive:  Alert and Appropriate  Insight:  Good  Engagement in Group:  Good  Additional Comments:  Pt actively participated in group and shared that she was thankful for her children and that she was able to speed read.  Pt encouraged her female peer to think of things he was thankful for.  Gwyndolyn Kaufman 12/16/2011, 1:07 PM

## 2011-12-16 NOTE — Tx Team (Signed)
Interdisciplinary Treatment Plan Update (Adult)  Date:  12/16/2011  Time Reviewed:  10:35 AM   Progress in Treatment: Attending groups: Yes Participating in groups:  Yes Taking medication as prescribed:  Yes Tolerating medication: Yes Family/Significant othe contact made: Attempting contact with mother  Patient understands diagnosis: Yes Discussing patient identified problems/goals with staff:  Yes Medical problems stabilized or resolved: Yes Denies suicidal/homicidal ideation: Yes Issues/concerns per patient self-inventory:  No  Other:  New problem(s) identified: None  Reason for Continuation of Hospitalization: Appropriate for discharge  Interventions implemented related to continuation of hospitalization:  Medication stabilization, safety checks q 15 mins, group attendance  Additional comments:  Estimated length of stay: Discharge today  Discharge Plan: [name] will discharge home with her mother and does not want any outpatient follow up   New goal(s):  Review of initial/current patient goals per problem list:   1.  Goal(s):  Reduce potential for suicide/self-harm  Met:  Yes  Target date: by discharge  As evidenced by: Gabriel Rung denies any thoughts of suicide  2.  Goal (s): Decrease depression to a rating of 4 or less  Met:  Yes  Target date: by discharge  As evidenced by: Mardi rates depression at 0 today  3.  Goal(s): Decrease anxiety to a rating of 4 or less  Met:  yes  Target date: by discharge  As evidenced by:  Gabriel Rung rates anxiety at 0   Attendees: Patient:  Anna Gregory 12/16/2011 10:35 AM  Family:     Physician:  Dr Orson Aloe, MD 12/16/2011 10:35 AM  Nursing:   Berneice Heinrich, RN 12/16/2011 10:35 AM  Case Manager:  Juline Patch, LCSW 12/16/2011 10:35 AM  Counselor:  Angus Palms, LCSW 12/16/2011 10:35 AM  Other:  Lajuana Matte, JMSW Intern 12/16/2011 10:35 AM  Other:  [place] Kirk, Nursing Student 12/16/2011 10:35 AM  Other:       Other:      Scribe for Treatment Team:   Billie Lade, 12/16/2011 10:35 AM

## 2011-12-16 NOTE — Progress Notes (Signed)
[facility] Case Management Discharge Plan:  Will you be returning to the same living situation after discharge: Yes,  Patient will return to the home she shares with family At discharge, do you have transportation home?:Yes,  Patient to arrange for family to transport her home Do you have the ability to pay for your medications:Yes,  Patient has private insurance and able to make co-pay  Interagency Information:     Release of information consent forms completed and in the chart;  Patient's signature needed at discharge.  Patient to Follow up at:  Follow-up Information    Follow up with [facility]. (You are scheduled with Geronimo Running @ 2:00 PM on  Monday, December 20, 2011.  Please arrive by 1:40 PM)    Contact information:   [address]  [place], [place]  [address]  [phone]         Patient denies SI/HI:   Yes,  Patient denies SI/HI or other thoughts of self harm    Safety Planning and Suicide Prevention discussed:  Yes,  Reviewed during aftercare group  Barrier to discharge identified:No.  Summary and Recommendations: Patient encouraged to be compliant with medications and follow up with outpatient recommendations    Wynn Banker 12/16/2011, 2:03 PM

## 2011-12-16 NOTE — Progress Notes (Signed)
  [facility] Adult Inpatient Family/Significant Other Suicide Prevention Education  Suicide Prevention Education:  Contact Attempts: Martinette Mimms, mother [phone]), has been identified by the patient as the family member/significant other with whom the patient will be residing, and identified as the person(s) who will aid the patient in the event of a mental health crisis.  With written consent from the patient, two attempts were made to provide suicide prevention education, prior to and/or following the patient's discharge.  We were unsuccessful in providing suicide prevention education.  A suicide education pamphlet was given to the patient to share with family/significant other.  Date and time of first attempt: 12/16/2011 12:14pm Date and time of second attempt: 12/16/2011 2:39 PM  Elysha was provided with a suicide prevention pamphlet, and information contained therein (risk factors, warning signs, and crisis resources) was discussed with her. She denies any access to guns and stated that she has no thoughts of harming herself or others.   Billie Lade 12/16/2011, 2:38 PM

## 2011-12-16 NOTE — Tx Team (Signed)
Interdisciplinary Treatment Plan Update (Adult)  Date:  12/16/2011  Time Reviewed:  10:00 AM   Progress in Treatment: Attending groups:   Yes   Participating in groups:  Yes Taking medication as prescribed:  Yes Tolerating medication:  Yes Family/Significant othe contact made: Counselor to follow up on collateral contact Patient understands diagnosis:  Yes Discussing patient identified problems/goals with staff: Yes Medical problems stabilized or resolved: Yes Denies suicidal/homicidal ideation:Yes Issues/concerns per patient self-inventory:  Other:  New problem(s) identified:  Reason for Continuation of Hospitalization:  Interventions implemented related to continuation of hospitalization:   Additional comments:  Estimated length of stay: Discharge today  Discharge Plan: Home with outpatient follow up  New goal(s):  Review of initial/current patient goals per problem list:    1.  Goal(s): Eliminate SI/other thoughts of self harm   Met:  Yes  Target date: d/c  As evidenced by: Patient is denying SI/HI or other thoughts of self harm.    2.  Goal (s):  Reduce depression/anxiety (Patient denies all symptoms)   Met:  Yes  Target date: d/c  As evidenced by: Patient currently rating symptoms at four or below    3.  Goal(s):  Refer for outpatient follow up   Met:  Yes  Target date: d/c  As evidenced by:Follow up appointment scheduled     Attendees: Patient:  Anna Gregory 12/16/2011 10:01 AM  Family:     Physician:  Orson Aloe, MD 12/16/2011 10:00 AM   Nursing:  Berneice Heinrich, RN 12/16/2011 10:00 AM   CaseManager:  Juline Patch, LCSW 12/16/2011 10:00 AM   Counselor:  Angus Palms, LCSW 12/16/2011 10:00 AM   Other:  [place] Kirk, Nursing Student     .12/16/2011 10:03 AM

## 2011-12-17 NOTE — Progress Notes (Signed)
Patient Discharge Instructions:  After Visit Summary (AVS):   Faxed to:  12/17/2011 Psychiatric Admission Assessment Note:   Faxed to:  12/17/2011 Suicide Risk Assessment - Discharge Assessment:   Faxed to:  12/17/2011 Faxed/Sent to the Next Level Care provider:  12/17/2011  Faxed to [facility] @ [phone]  Wandra Scot, 12/17/2011, 4:36 PM

## 2011-12-21 NOTE — H&P (Signed)
Medical/psychiatric screening examination/treatment/procedure(s) were performed by non-physician practitioner and as supervising physician I was immediately available for consultation/collaboration.   

## 2012-01-31 NOTE — Discharge Summary (Signed)
Physician Discharge Summary Note  Patient:  Anna Gregory is an 26 y.o., female MRN:  [id number] DOB:  1985-04-03 Patient phone:  [phone] (home)  Patient address:   [address] [place] [address]   Date of Admission:  12/15/2011 Date of Discharge: 12/16/2011  Discharge Diagnoses: Principal Problem:  *Depression Active Problems:  Cannabis use, uncomplicated  Axis Diagnosis:  AXIS I: Major Depression, single episode, Substance Induced Mood Disorder and Cannabis and Nicotine Dependence  AXIS II: Deferred  AXIS III:  Past Medical History   Diagnosis  Date   .  PONV (postoperative nausea and vomiting)    .  Depression    .  Urinary tract infection    .  Chlamydia    .  Trichomonas     AXIS IV: other psychosocial or environmental problems  AXIS V: 51-60 moderate symptoms   Level of Care:  OP  Hospital Course:   This a 26 year old African-American female, admitted to [facility] from the [facility] ED with reports of suspected suicide attempt by overdose on tylenol pm. Patient reports, "I was taken to the [facility] ED last Tuesday night by the EMS. What happened was, I had taken 14 tablets of Tylenol PM to help me sleep and also help my left wrist because it was hurting. 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 So, it always hurts, and at night time, it keeps from sleeping well. This OTC medicine is also generic. It was not so effective. I keeping taking the pills, 2 tablets at time to see if I will fall asleep. Then it all came down on me one time. I fell hard asleep. I was actually out of it. My mother saw me, probably had hard time waking me up. She panicked and called 911. I stayed in the hospital x 2 days. The reason for the 2 days was because, they found too much tylenol in my system after they ran a blood test. Then everyone start to asking questions about how long have you been thinking and or planning to  hurt myself and why. I told them that I was not suicidal, I have 2 beautiful children that I love. I was in a lot of stress, and who is not. That is not a reason to kill myself. The reason for the IVC was because I called a nurse a b.... word. But she ordered me to remove my lip piecing, I told her that it is not removal, but she ignored me and tried to yank it off my lip, it hurts so much that I got mad and called her the bad name. Then every one of them start to say that I was unstable, even though she drove me to calling her the ugly name".   While a patient in this hospital, Anna Gregory was enrolled in group counseling and activities as well as received the following medication No current facility-administered medications for this encounter. Current outpatient prescriptions:ibuprofen (ADVIL,MOTRIN) 600 MG tablet, Take 600 mg by mouth every 6 (six) hours as needed. For pain, Disp: , Rfl: ;  ciprofloxacin (CIPRO) 500 MG tablet, Take 1 tablet (500 mg total) by mouth 2 (two) times daily. For infection, Disp: 5 tablet, Rfl: 0 hydrOXYzine (ATARAX/VISTARIL) 50 MG tablet, Take 1 tablet (50 mg total) by mouth at bedtime and may repeat dose one time if needed. For insomnia., Disp: 60 tablet, Rfl: 0;  nicotine (NICODERM CQ - DOSED IN MG/24 HOURS) 21 mg/24hr patch, Place 1 patch onto the skin daily at 6 (six) AM. For smoking cessation., Disp: 28 patch, Rfl: 0 omeprazole (PRILOSEC) 20 MG capsule, Take 1 capsule (20 mg total) by mouth daily. Take one or two a day with regular Ibuprofen for wrist healing, Disp: 50 capsule, Rfl: 0  Patient attended treatment team meeting this am and met with treatment team members. Pt symptoms, treatment plan and response to treatment discussed. DELANO SCARDINO endorsed that their symptoms have improved. Pt also stated that they are stable for discharge.  In other to control Principal Problem:  *Depression Active Problems:  Cannabis use, uncomplicated , they will continue  psychiatric care on outpatient basis. They will follow-up at  Follow-up Information    Follow up with [facility]. (You are scheduled with Geronimo Running @ 2:00 PM on  Monday, December 20, 2011.  Please arrive by 1:40 PM)    Contact information:   [address]  [place], [place]  [address]  [phone]       .  In addition they were instructed to take all your medications as prescribed by your mental healthcare provider, to report any adverse effects and or reactions from your medicines to your outpatient provider promptly, patient is instructed and cautioned to not engage in alcohol and or illegal drug use while on prescription medicines, in the event of worsening symptoms, patient is instructed to call the crisis hotline, 911 and or go to the nearest ED for appropriate evaluation and treatment of symptoms.   Upon discharge, patient adamantly denies suicidal, homicidal ideations, auditory, visual hallucinations and or delusional thinking. They left [facility] with all personal belongings in no apparent distress.  Consults:  See electronic record for details  Significant Diagnostic Studies:  See electronic record for details  Discharge Vitals:   Blood pressure 126/87, pulse 93, temperature 98.8 F (37.1 C), temperature source Oral, height 5' 6.5" (1.689 m), weight 199 lb (90.266 kg), last menstrual period 11/04/2011..  Mental Status Exam: See Mental Status Examination and Suicide Risk Assessment completed by Attending Physician prior to discharge.  Discharge destination:  Home  Is patient on multiple antipsychotic therapies at discharge:  No  Has Patient had three or more failed trials of antipsychotic monotherapy by history: N/A Recommended Plan for Multiple Antipsychotic Therapies: N/A    Medication List     As of 01/31/2012 12:48 PM    TAKE these medications      Indication    ciprofloxacin 500 MG tablet   Commonly known as: CIPRO   Take 1 tablet (500 mg total) by mouth 2  (two) times daily. For infection       hydrOXYzine 50 MG tablet   Commonly known as: ATARAX/VISTARIL   Take 1 tablet (50 mg total) by mouth at bedtime and may repeat dose one time if needed. For insomnia.       ibuprofen 600 MG tablet   Commonly known as: ADVIL,MOTRIN   Take 600 mg by mouth every 6 (six) hours as needed. For pain       nicotine 21 mg/24hr patch   Commonly known as: NICODERM CQ - dosed in mg/24 hours   Place 1 patch onto the skin daily at 6 (six) AM. For smoking cessation.       omeprazole 20 MG capsule   Commonly known as: PRILOSEC   Take 1 capsule (20 mg total) by mouth daily. Take one or two a day  with regular Ibuprofen for wrist healing            Follow-up Information    Follow up with [facility] (You are scheduled with Geronimo Running @ 2:00 PM on  Monday, December 20, 2011.  Please arrive by 1:40 PM)    Contact information:   [address]  [place], [place]  [address]  [phone]        Follow-up recommendations:   Activities: Resume typical activities Diet: Resume typical diet Tests: none Other: Follow up with outpatient provider and report any side effects to out patient prescriber.  Comments:  Take all your medications as prescribed by your mental healthcare provider. Report any adverse effects and or reactions from your medicines to your outpatient provider promptly. Patient is instructed and cautioned to not engage in alcohol and or illegal drug use while on prescription medicines. In the event of worsening symptoms, patient is instructed to call the crisis hotline, 911 and or go to the nearest ED for appropriate evaluation and treatment of symptoms. Follow-up with your primary care provider for your other medical issues, concerns and or health care needs.  SignedDan Humphreys, Teiara Baria 01/31/2012 12:49 PM

## 2012-09-28 IMAGING — US US RENAL
1 series · 14 of 25 positions shown · non-contrast
Comparison: None.

CLINICAL DATA: Pregnant patient with back pain and dysuria

RENAL/URINARY TRACT ULTRASOUND COMPLETE

[Series 1: us renal · 52 acquisitions, 14 frames shown]
[im 1/52]
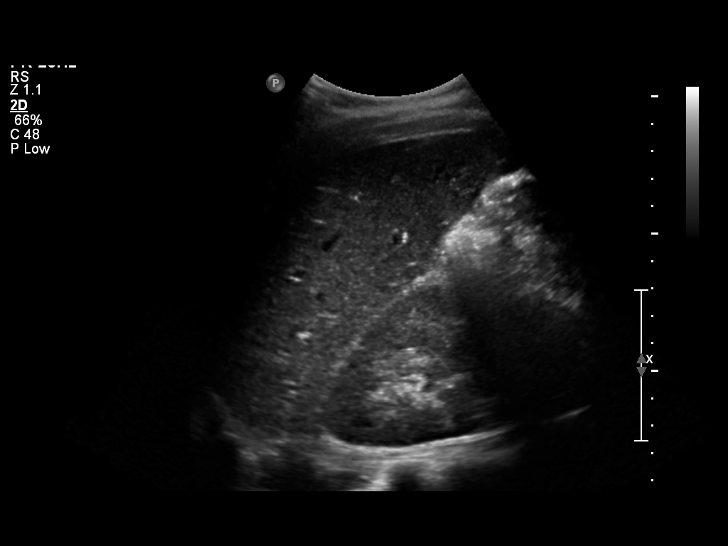
[im 5/52]
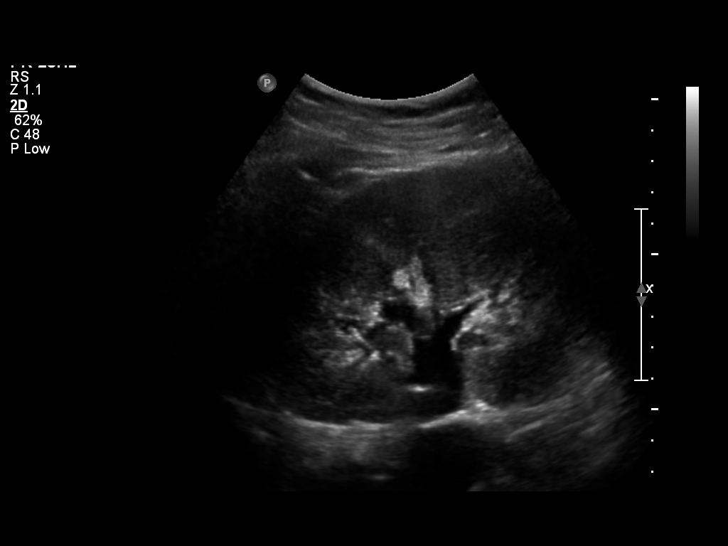
[im 9/52]
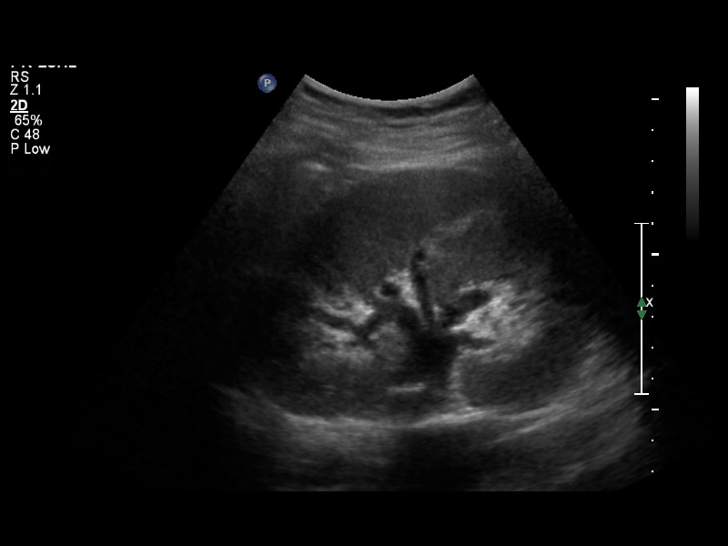
[im 13/52]
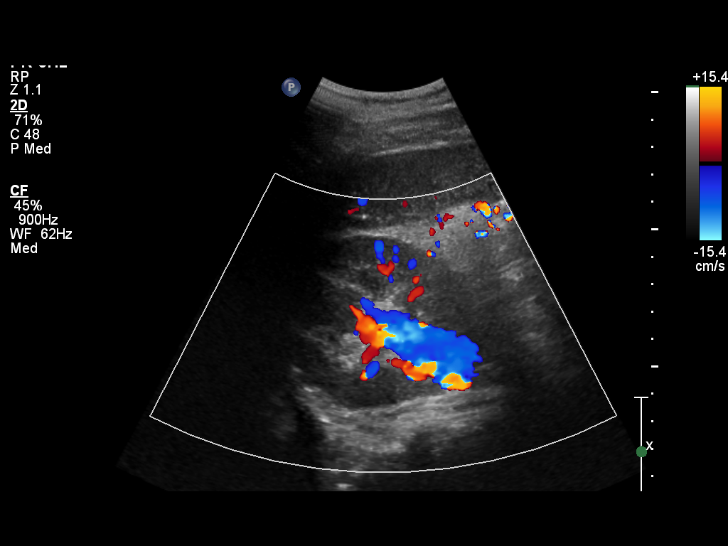
[im 18/52]
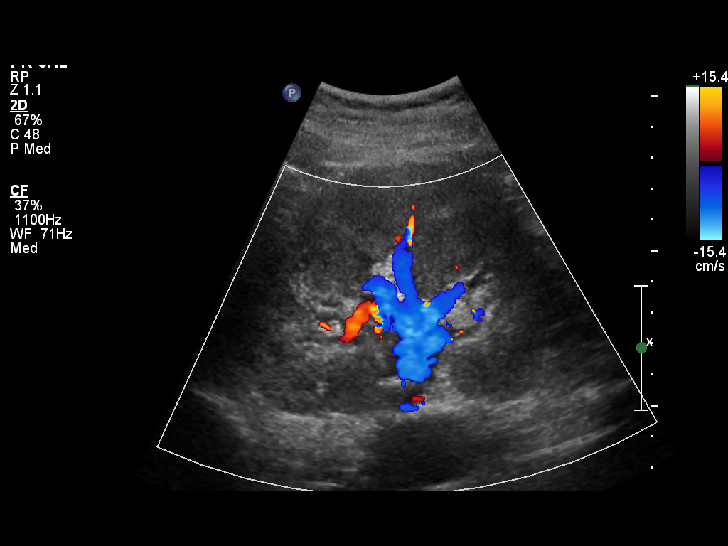
[im 20/52]
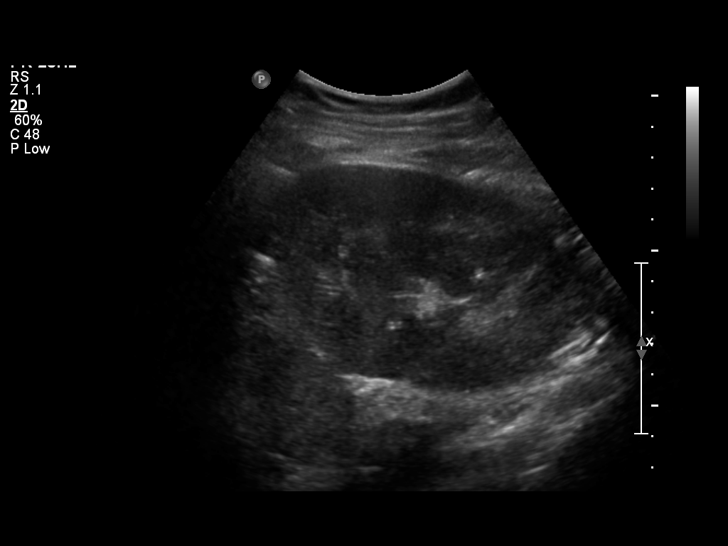
[im 24/52]
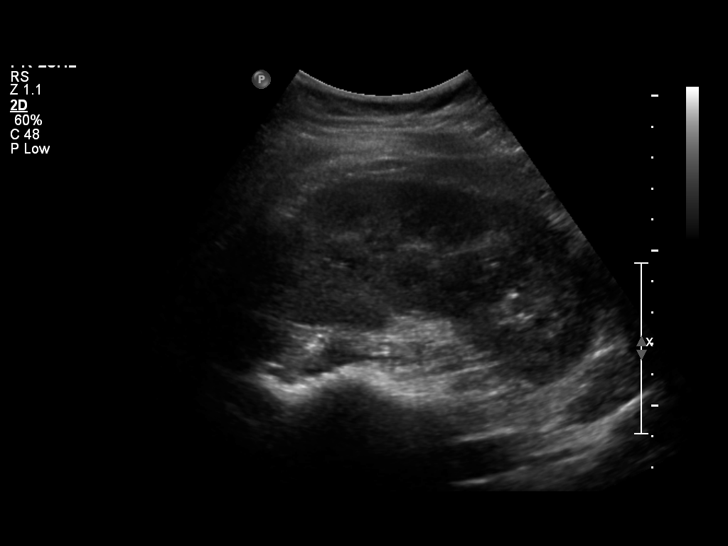
[im 28/52]
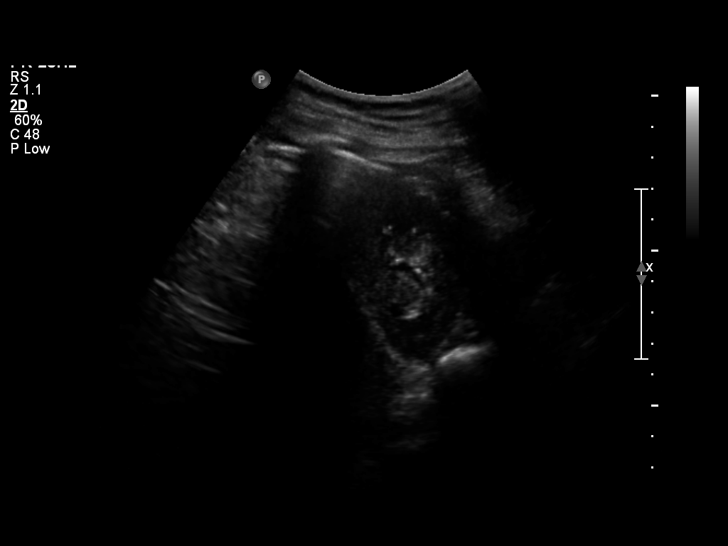
[im 32/52]
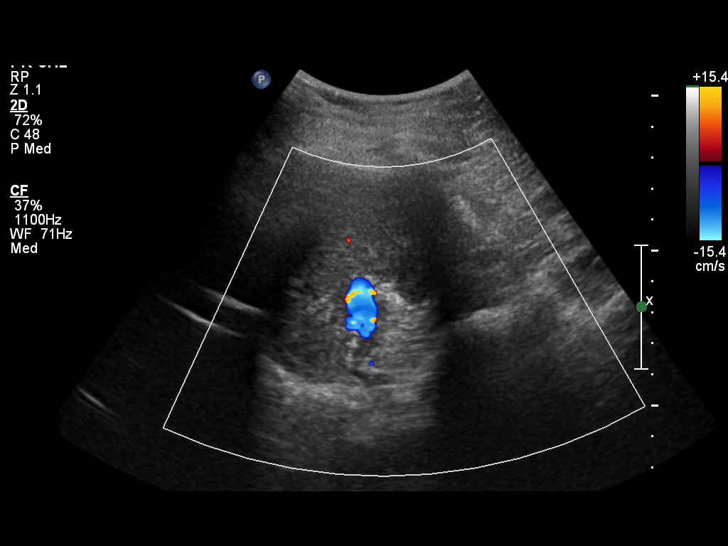
[im 35/52]
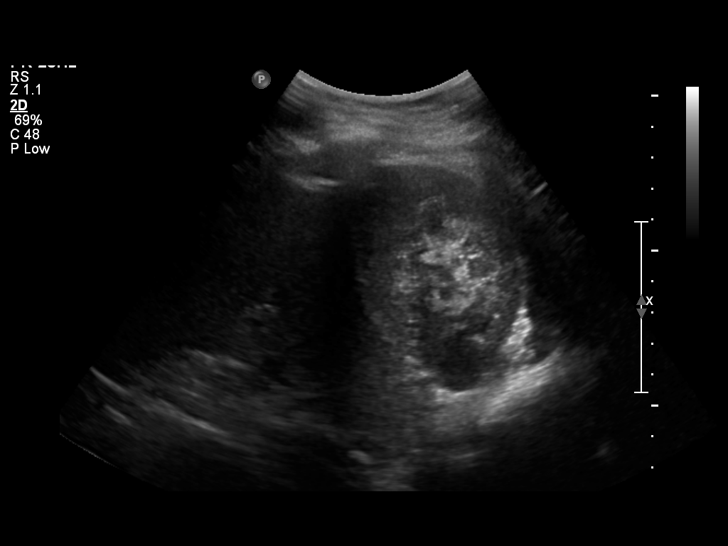
[im 39/52]
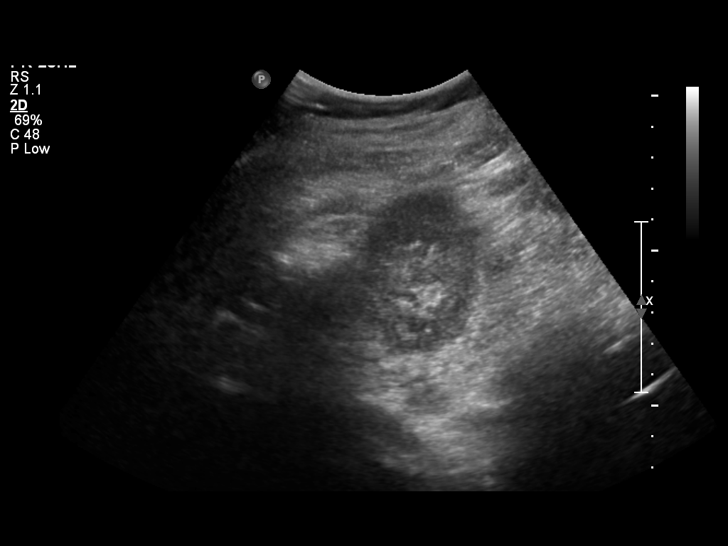
[im 43/52]
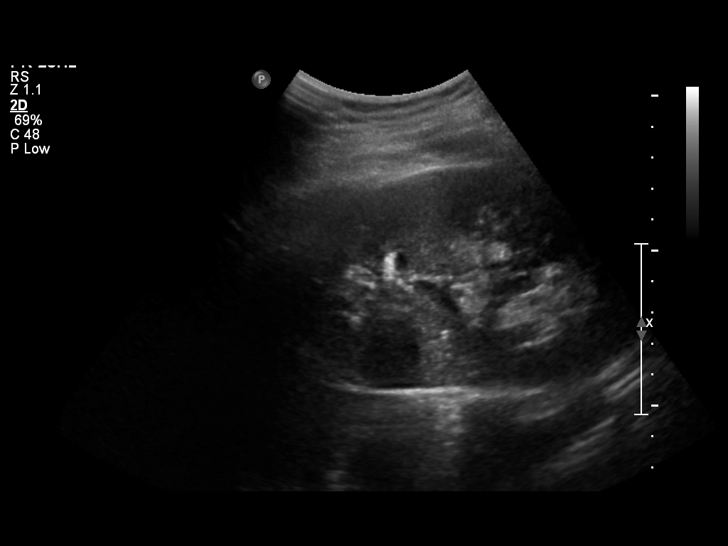
[im 47/52]
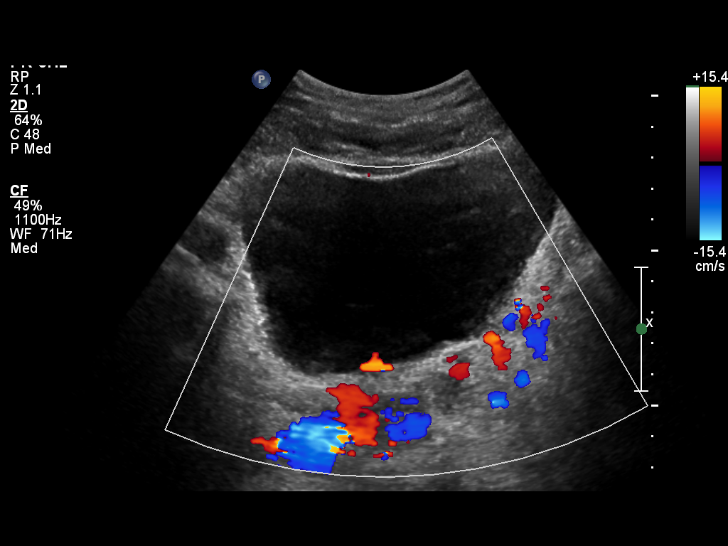
[im 52/52]
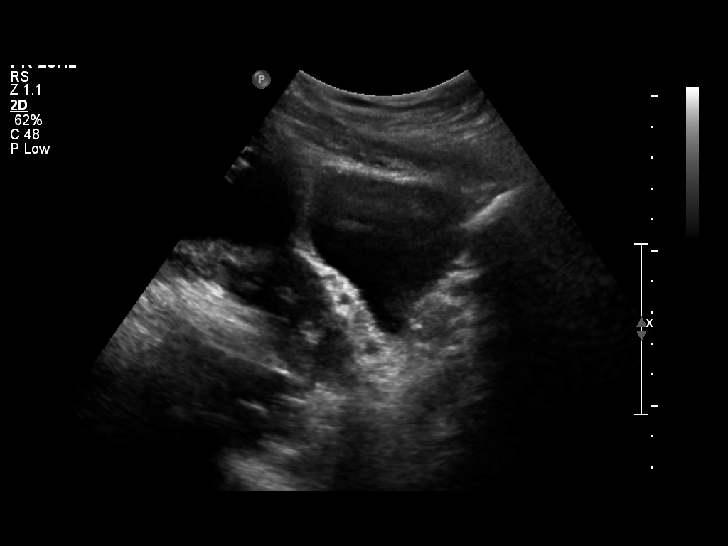

[14 of 25 positions shown; findings below may reference images not displayed]

FINDINGS: Right Kidney:  Normal in size and parenchymal echogenicity.
Measures 12.2 cm. No evidence of mass or hydronephrosis.

Left Kidney:  Normal in size and parenchymal echogenicity.
Measures 12.2 cm. No evidence of mass or hydronephrosis.

Bladder:  Appears normal for degree of bladder distention.
Bilateral ureteral jets are demonstrated.
IMPRESSION: Normal study.

## 2013-06-14 ENCOUNTER — Emergency Department (HOSPITAL_COMMUNITY)
Admission: EM | Admit: 2013-06-14 | Discharge: 2013-06-14 | Disposition: A | Discharge status: Home / Self Care | Payer: Medicaid Other | Attending: Emergency Medicine | Admitting: Emergency Medicine

## 2013-06-14 ENCOUNTER — Encounter (HOSPITAL_COMMUNITY): Payer: Self-pay | Admitting: Emergency Medicine

## 2013-06-14 DIAGNOSIS — Z9851 Tubal ligation status: Secondary | ICD-10-CM | POA: Insufficient documentation

## 2013-06-14 DIAGNOSIS — N730 Acute parametritis and pelvic cellulitis: Secondary | ICD-10-CM | POA: Insufficient documentation

## 2013-06-14 DIAGNOSIS — Z792 Long term (current) use of antibiotics: Secondary | ICD-10-CM | POA: Insufficient documentation

## 2013-06-14 DIAGNOSIS — A5901 Trichomonal vulvovaginitis: Secondary | ICD-10-CM | POA: Insufficient documentation

## 2013-06-14 DIAGNOSIS — Z3202 Encounter for pregnancy test, result negative: Secondary | ICD-10-CM | POA: Insufficient documentation

## 2013-06-14 DIAGNOSIS — Z87440 Personal history of urinary (tract) infections: Secondary | ICD-10-CM | POA: Insufficient documentation

## 2013-06-14 DIAGNOSIS — Z8659 Personal history of other mental and behavioral disorders: Secondary | ICD-10-CM | POA: Insufficient documentation

## 2013-06-14 DIAGNOSIS — A599 Trichomoniasis, unspecified: Secondary | ICD-10-CM

## 2013-06-14 DIAGNOSIS — F17200 Nicotine dependence, unspecified, uncomplicated: Secondary | ICD-10-CM | POA: Insufficient documentation

## 2013-06-14 LAB — WET PREP, GENITAL: Yeast Wet Prep HPF POC: NONE SEEN

## 2013-06-14 LAB — URINALYSIS, ROUTINE W REFLEX MICROSCOPIC
Bilirubin Urine: NEGATIVE
Glucose, UA: NEGATIVE mg/dL
Hgb urine dipstick: NEGATIVE
KETONES UR: NEGATIVE mg/dL
LEUKOCYTES UA: NEGATIVE
NITRITE: NEGATIVE
PROTEIN: NEGATIVE mg/dL
Specific Gravity, Urine: 1.021 (ref 1.005–1.030)
Urobilinogen, UA: 1 mg/dL (ref 0.0–1.0)
pH: 8.5 — ABNORMAL HIGH (ref 5.0–8.0)

## 2013-06-14 LAB — PREGNANCY, URINE: PREG TEST UR: NEGATIVE

## 2013-06-14 MED ORDER — DOXYCYCLINE HYCLATE 100 MG PO TABS
100.0000 mg | ORAL_TABLET | Freq: Once | ORAL | Status: AC
  Administered 2013-06-14: 100 mg via ORAL
  Filled 2013-06-14: qty 1

## 2013-06-14 MED ORDER — METRONIDAZOLE 500 MG PO TABS
2000.0000 mg | ORAL_TABLET | Freq: Once | ORAL | Status: AC
  Administered 2013-06-14: 2000 mg via ORAL
  Filled 2013-06-14: qty 4

## 2013-06-14 MED ORDER — DOXYCYCLINE HYCLATE 100 MG PO CAPS: 100.0000 mg | ORAL_CAPSULE | Freq: Two times a day (BID) | ORAL | Status: DC

## 2013-06-14 MED ORDER — LIDOCAINE HCL 1 % IJ SOLN
0.9000 mL | Freq: Once | INTRAMUSCULAR | Status: AC
  Administered 2013-06-14: 0.9 mL

## 2013-06-14 MED ORDER — CEFTRIAXONE SODIUM 250 MG IJ SOLR
250.0000 mg | Freq: Once | INTRAMUSCULAR | Status: AC
Start: 2013-06-14 — End: 2013-06-14
  Administered 2013-06-14: 250 mg via INTRAMUSCULAR
  Filled 2013-06-14: qty 250

## 2013-06-14 MED ORDER — LIDOCAINE HCL 1 % IJ SOLN
INTRAMUSCULAR | Status: AC
  Filled 2013-06-14: qty 20

## 2013-06-14 NOTE — ED Notes (Signed)
Pt alert, arrives from home, c/o dysuria, vaginal discharge, onset was last week, resp even unlabored, skin pwd

## 2013-06-14 NOTE — ED Notes (Signed)
Pelvic is set up 

## 2013-06-14 NOTE — Discharge Instructions (Signed)
Return to the ED with any concerns including vomiting and not able to keep down liquids or antibiotics, worsening abdominal pain, fever/chills, decreased level of alertness/lethargy, or any other alarming symptoms °

## 2013-06-14 NOTE — ED Notes (Signed)
EDP aware pelvic is set up.

## 2013-06-14 NOTE — ED Notes (Signed)
Initial Contact - pt to [facility], reports vag discharge, intermittent lower abd pain and dysuria "for a few weeks".  Pt denies fevers/chills or other complaints.  Abd s/nt/nd.  +bsx4 quads.  Skin PWD.  MAEI.  Changed to hospital gown.  NAD.

## 2013-06-14 NOTE — ED Provider Notes (Signed)
CSN: [id number]     Arrival date & time 06/14/13  1051 History   First MD Initiated Contact with Patient 06/14/13 1136     Chief Complaint  Patient presents with  . Dysuria     (Consider location/radiation/quality/duration/timing/severity/associated sxs/prior Treatment) HPI Pt presenting with c/o vaginal discharge, dysuria.  Symptoms began one week ago.  No fever/chills.  Some lower abdominal pain and cramping.  No nausea or vomiting.  Pt has not had any recent antibiotics.  Symptoms are constant.  Worse this morning.  There are no other associated systemic symptoms, there are no other alleviating or modifying factors.   Past Medical History  Diagnosis Date  . PONV (postoperative nausea and vomiting)   . Depression   . Urinary tract infection   . Chlamydia   . Trichomonas    Past Surgical History  Procedure Laterality Date  . Cesarean section    . Tubal ligation  01/10/2011    Procedure: POST PARTUM TUBAL LIGATION;  Surgeon: Catalina AntiguaPeggy Constant, MD;  Location: [facility];  Service: Gynecology;  Laterality: Bilateral;  Bilateral post partum tubal ligation.   Family History  Problem Relation Age of Onset  . Hypertension Maternal Grandmother   . Diabetes Paternal Grandfather    History  Substance Use Topics  . Smoking status: Current Some Day Smoker -- 0.25 packs/day for 3 years  . Smokeless tobacco: Never Used  . Alcohol Use: No   OB History   Grav Para Term Preterm Abortions TAB SAB Ect Mult Living   7 5 5  0 2 1 1   5      Review of Systems ROS reviewed and all otherwise negative except for mentioned in HPI    Allergies  Strawberry and Aspirin  Home Medications   Current Outpatient Rx  Name  Route  Sig  Dispense  Refill  . doxycycline (VIBRAMYCIN) 100 MG capsule   Oral   Take 1 capsule (100 mg total) by mouth 2 (two) times daily.   28 capsule   0    BP 103/66  Pulse 66  Temp(Src) 98.1 F (36.7 C) (Oral)  Resp 14  SpO2 100%  LMP 05/31/2013 Vitals  reviewed Physical Exam Physical Examination: General appearance - alert, well appearing, and in no distress Mental status - alert, oriented to person, place, and time Eyes - no scleral icterus, no conjunctival injection Mouth - mucous membranes moist, pharynx normal without lesions Chest - clear to auscultation, no wheezes, rales or rhonchi, symmetric air entry Heart - normal rate, regular rhythm, normal S1, S2, no murmurs, rubs, clicks or gallops Abdomen - soft, ttp in lower abdomen, suprapubic region, nabs, no gaurding or rebound, nondistended, no masses or organomegaly Pelvic- white discharge from cervical os, +CMT, + ttp over fundus, no adnexal tenderness Extremities - peripheral pulses normal, no pedal edema, no clubbing or cyanosis Skin - normal coloration and turgor, no rashes  ED Course  Procedures (including critical care time) Labs Review Labs Reviewed  WET PREP, GENITAL - Abnormal; Notable for the following:    Trich, Wet Prep FEW (*)    Clue Cells Wet Prep HPF POC FEW (*)    WBC, Wet Prep HPF POC FEW (*)    All other components within normal limits  URINALYSIS, ROUTINE W REFLEX MICROSCOPIC - Abnormal; Notable for the following:    pH 8.5 (*)    All other components within normal limits  GC/CHLAMYDIA PROBE AMP  PREGNANCY, URINE   Imaging Review No results found.  EKG Interpretation None      MDM   Final diagnoses:  PID (acute pelvic inflammatory disease)  Trichimoniasis    Pt presenting with c/o lower abdominal pain as well as dysuria and vaginal discharge. UA is reassuring.  Pelvic exam is c/w PID.  Wet prep shows WBCs, trich.  Pt treated with rocephin, doxy and flagyl in the ED.   Patient is overall nontoxic and well hydrated in appearance.  Stable for outpatient treatment.  Discharged with strict return precautions.  Pt agreeable with plan.     Ethelda Chick, MD 06/14/13 [phone]

## 2013-06-14 NOTE — Progress Notes (Signed)
[facility] provided pt with a list of primary care resources and a [facility] [employer] Card application to help patient establish primary care.  °

## 2013-06-15 LAB — GC/CHLAMYDIA PROBE AMP
CT PROBE, AMP APTIMA: NEGATIVE
GC PROBE AMP APTIMA: NEGATIVE

## 2013-06-23 ENCOUNTER — Encounter (HOSPITAL_COMMUNITY): Payer: Self-pay | Admitting: Emergency Medicine

## 2013-06-23 ENCOUNTER — Emergency Department (HOSPITAL_COMMUNITY)
Admission: EM | Admit: 2013-06-23 | Discharge: 2013-06-23 | Disposition: A | Discharge status: Home / Self Care | Payer: Medicaid Other | Attending: Emergency Medicine | Admitting: Emergency Medicine

## 2013-06-23 ENCOUNTER — Emergency Department (HOSPITAL_COMMUNITY): Payer: Medicaid Other

## 2013-06-23 DIAGNOSIS — Z8619 Personal history of other infectious and parasitic diseases: Secondary | ICD-10-CM | POA: Insufficient documentation

## 2013-06-23 DIAGNOSIS — Z87440 Personal history of urinary (tract) infections: Secondary | ICD-10-CM | POA: Insufficient documentation

## 2013-06-23 DIAGNOSIS — Z792 Long term (current) use of antibiotics: Secondary | ICD-10-CM | POA: Insufficient documentation

## 2013-06-23 DIAGNOSIS — F17200 Nicotine dependence, unspecified, uncomplicated: Secondary | ICD-10-CM | POA: Insufficient documentation

## 2013-06-23 DIAGNOSIS — S0181XA Laceration without foreign body of other part of head, initial encounter: Secondary | ICD-10-CM

## 2013-06-23 DIAGNOSIS — Z8659 Personal history of other mental and behavioral disorders: Secondary | ICD-10-CM | POA: Insufficient documentation

## 2013-06-23 DIAGNOSIS — S022XXA Fracture of nasal bones, initial encounter for closed fracture: Secondary | ICD-10-CM | POA: Insufficient documentation

## 2013-06-23 DIAGNOSIS — S0990XA Unspecified injury of head, initial encounter: Secondary | ICD-10-CM

## 2013-06-23 DIAGNOSIS — S0180XA Unspecified open wound of other part of head, initial encounter: Secondary | ICD-10-CM | POA: Insufficient documentation

## 2013-06-23 MED ORDER — OXYCODONE-ACETAMINOPHEN 5-325 MG PO TABS
1.0000 | ORAL_TABLET | ORAL | Status: DC | PRN
Start: 2013-06-23 — End: 2014-08-27

## 2013-06-23 MED ORDER — OXYCODONE-ACETAMINOPHEN 5-325 MG PO TABS
1.0000 | ORAL_TABLET | Freq: Once | ORAL | Status: AC
  Administered 2013-06-23: 1 via ORAL
  Filled 2013-06-23: qty 1
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
## 2013-06-23 NOTE — ED Notes (Signed)
[employer] at bedside 
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
## 2013-06-23 NOTE — ED Notes (Signed)
Assumed care of patient Patient medicated for c/o generalized pain, see MAR Patient and family member deny further needs or complaints at this time Patient appears in NAD Side rails up, call bell in reach

## 2013-06-23 NOTE — Discharge Instructions (Signed)
Laceration Care, Adult °A laceration is a cut or lesion that goes through all layers of the skin and into the tissue just beneath the skin. °TREATMENT  °Some lacerations may not require closure. Some lacerations may not be able to be closed due to an increased risk of infection. It is important to see your caregiver as soon as possible after an injury to minimize the risk of infection and maximize the opportunity for successful closure. °If closure is appropriate, pain medicines may be given, if needed. The wound will be cleaned to help prevent infection. Your caregiver will use stitches (sutures), staples, wound glue (adhesive), or skin adhesive strips to repair the laceration. These tools bring the skin edges together to allow for faster healing and a better cosmetic outcome. However, all wounds will heal with a scar. Once the wound has healed, scarring can be minimized by covering the wound with sunscreen during the day for 1 full year. °HOME CARE INSTRUCTIONS  °For sutures or staples: °· Keep the wound clean and dry. °· If you were given a bandage (dressing), you should change it at least once a day. Also, change the dressing if it becomes wet or dirty, or as directed by your caregiver. °· Wash the wound with soap and water 2 times a day. Rinse the wound off with water to remove all soap. Pat the wound dry with a clean towel. °· After cleaning, apply a thin layer of the antibiotic ointment as recommended by your caregiver. This will help prevent infection and keep the dressing from sticking. °· You may shower as usual after the first 24 hours. Do not soak the wound in water until the sutures are removed. °· Only take over-the-counter or prescription medicines for pain, discomfort, or fever as directed by your caregiver. °· Get your sutures or staples removed as directed by your caregiver. °For skin adhesive strips: °· Keep the wound clean and dry. °· Do not get the skin adhesive strips wet. You may bathe  carefully, using caution to keep the wound dry. °· If the wound gets wet, pat it dry with a clean towel. °· Skin adhesive strips will fall off on their own. You may trim the strips as the wound heals. Do not remove skin adhesive strips that are still stuck to the wound. They will fall off in time. °For wound adhesive: °· You may briefly wet your wound in the shower or bath. Do not soak or scrub the wound. Do not swim. Avoid periods of heavy perspiration until the skin adhesive has fallen off on its own. After showering or bathing, gently pat the wound dry with a clean towel. °· Do not apply liquid medicine, cream medicine, or ointment medicine to your wound while the skin adhesive is in place. This may loosen the film before your wound is healed. °· If a dressing is placed over the wound, be careful not to apply tape directly over the skin adhesive. This may cause the adhesive to be pulled off before the wound is healed. °· Avoid prolonged exposure to sunlight or tanning lamps while the skin adhesive is in place. Exposure to ultraviolet light in the first year will darken the scar. °· The skin adhesive will usually remain in place for 5 to 10 days, then naturally fall off the skin. Do not pick at the adhesive film. °You may need a tetanus shot if: °· You cannot remember when you had your last tetanus shot. °· You have never had a tetanus   shot. If you get a tetanus shot, your arm may swell, get red, and feel warm to the touch. This is common and not a problem. If you need a tetanus shot and you choose not to have one, there is a rare chance of getting tetanus. Sickness from tetanus can be serious. SEEK MEDICAL CARE IF:   You have redness, swelling, or increasing pain in the wound.  You see a red line that goes away from the wound.  You have yellowish-white fluid (pus) coming from the wound.  You have a fever.  You notice a bad smell coming from the wound or dressing.  Your wound breaks open before or  after sutures have been removed.  You notice something coming out of the wound such as wood or glass.  Your wound is on your hand or foot and you cannot move a finger or toe. SEEK IMMEDIATE MEDICAL CARE IF:   Your pain is not controlled with prescribed medicine.  You have severe swelling around the wound causing pain and numbness or a change in color in your arm, hand, leg, or foot.  Your wound splits open and starts bleeding.  You have worsening numbness, weakness, or loss of function of any joint around or beyond the wound.  You develop painful lumps near the wound or on the skin anywhere on your body. MAKE SURE YOU:   Understand these instructions.  Will watch your condition.  Will get help right away if you are not doing well or get worse. Document Released: 03/08/2005 Document Revised: 05/31/2011 Document Reviewed: 09/01/2010 [facility] Patient Information 2014 [place], [place].  Nasal Fracture A nasal fracture is a break or crack in the bones of the nose. A minor break usually heals in a month. You often will receive black eyes from a nasal fracture. This is not a cause for concern. The black eyes will go away over 1 to 2 weeks.  DIAGNOSIS  Your caregiver may want to examine you if you are concerned about a fracture of the nose. X-rays of the nose may not show a nasal fracture even when one is present. Sometimes your caregiver must wait 1 to 5 days after the injury to re-check the nose for alignment and to take additional X-rays. Sometimes the caregiver must wait until the swelling has gone down. TREATMENT Minor fractures that have caused no deformity often do not require treatment. More serious fractures where bones are displaced may require surgery. This will take place after the swelling is gone. Surgery will stabilize and align the fracture. HOME CARE INSTRUCTIONS   Put ice on the injured area.  Put ice in a plastic bag.  Place a towel between your skin and the  bag.  Leave the ice on for 15-20 minutes, 03-04 times a day.  Take medications as directed by your caregiver.  Only take over-the-counter or prescription medicines for pain, discomfort, or fever as directed by your caregiver.  If your nose starts bleeding, squeeze the soft parts of the nose against the center wall while you are sitting in an upright position for 10 minutes.  Contact sports should be avoided for at least 3 to 4 weeks or as directed by your caregiver. SEEK MEDICAL CARE IF:  Your pain increases or becomes severe.  You continue to have nosebleeds.  The shape of your nose does not return to normal within 5 days.  You have pus draining from the nose. SEEK IMMEDIATE MEDICAL CARE IF:   You have bleeding from  your nose that does not stop after 20 minutes of pinching the nostrils closed and keeping ice on the nose.  You have clear fluid draining from your nose.  You notice a grape-like swelling on the dividing wall between the nostrils (septum). This is a collection of blood (hematoma) that must be drained to help prevent infection.  You have difficulty moving your eyes.  You have recurrent vomiting. Document Released: 03/05/2000 Document Revised: 05/31/2011 Document Reviewed: 06/22/2010 [facility] Patient Information 2014 [place].  Head Injury, Adult You have a head injury. Headaches and throwing up (vomiting) are common after a head injury. It should be easy to wake up from sleeping. Sometimes you must stay in the hospital. Most problems happen within the first 24 hours. Side effects may occur up to 7 10 days after the injury.  WHAT ARE THE TYPES OF HEAD INJURIES? Head injuries can be as minor as a bump. Some head injuries can be more severe. More severe head injuries include:  A jarring injury to the brain (concussion).  A bruise of the brain (contusion). This mean there is bleeding in the brain that can cause swelling.  A cracked skull (skull  fracture).  Bleeding in the brain that collects, clots, and forms a bump (hematoma). . WHEN SHOULD I GET HELP RIGHT AWAY?   You are confused or sleepy.  You cannot be woken up.  You feel sick to your stomach (nauseous) or keep throwing up.  Your dizziness or unsteadiness is get worse.  You have very bad, lasting headaches that are not helped by medicine.  You cannot use your arms or legs like normal  You cannot walk.  You notice changes in the black spots in the center of the colored part of your eye (pupil).  You have clear or bloody fluid coming from your nose or ears.  You have trouble seeing. During the next 24 hours after the injury, you must stay with someone who can watch you. This person should get help right away (call 911 in the [place] if you start to shake and are not able to control it (seizures), you become pass out, or you are unable to wake up. HOW CAN I PREVENT A HEAD INJURY IN THE FUTURE?  Wear seat belts.  Wear helmets while bike riding and playing sports like football.  Stay away from dangerous activities around the house. WHEN CAN I RETURN TO NORMAL ACTIVITIES AND ATHLETICS? See your doctor before doing these activities. You should not do normal activities or play contact sports until 1 week after the following symptoms have stopped:  Headache that does not go away.  Dizziness.  Poor attention.  Confusion.  Memory problems.  Sickness to your stomach or throwing up.  Tiredness.  Fussiness.  Bothered by bright lights or loud noises.  Anxiousness or depression.  Restless sleep. MAKE SURE YOU:   Understand these instructions.  Will watch your condition.  Will get help right away if you are not doing well or get worse. Document Released: 02/19/2008 Document Revised: 12/27/2012 Document Reviewed: 11/13/2012 [facility] Patient Information 2014 [place], [place].

## 2013-06-23 NOTE — ED Notes (Addendum)
Pt. Wound cleaned up with saline and gauze for suture. 

## 2013-06-23 NOTE — ED Notes (Signed)
Pt put on C-Collar.

## 2014-01-21 ENCOUNTER — Encounter (HOSPITAL_COMMUNITY): Payer: Self-pay | Admitting: Emergency Medicine

## 2014-06-02 ENCOUNTER — Encounter (HOSPITAL_COMMUNITY): Payer: Self-pay | Admitting: Nurse Practitioner

## 2014-06-02 ENCOUNTER — Emergency Department (HOSPITAL_COMMUNITY)
Admission: EM | Admit: 2014-06-02 | Discharge: 2014-06-02 | Disposition: A | Discharge status: Home / Self Care | Payer: Medicaid Other | Attending: Emergency Medicine | Admitting: Emergency Medicine

## 2014-06-02 DIAGNOSIS — Z3202 Encounter for pregnancy test, result negative: Secondary | ICD-10-CM | POA: Insufficient documentation

## 2014-06-02 DIAGNOSIS — N750 Cyst of Bartholin's gland: Secondary | ICD-10-CM | POA: Insufficient documentation

## 2014-06-02 DIAGNOSIS — Z8619 Personal history of other infectious and parasitic diseases: Secondary | ICD-10-CM | POA: Insufficient documentation

## 2014-06-02 DIAGNOSIS — Z87440 Personal history of urinary (tract) infections: Secondary | ICD-10-CM | POA: Insufficient documentation

## 2014-06-02 DIAGNOSIS — Z720 Tobacco use: Secondary | ICD-10-CM | POA: Insufficient documentation

## 2014-06-02 DIAGNOSIS — Z8659 Personal history of other mental and behavioral disorders: Secondary | ICD-10-CM | POA: Insufficient documentation

## 2014-06-02 DIAGNOSIS — Z79899 Other long term (current) drug therapy: Secondary | ICD-10-CM | POA: Insufficient documentation

## 2014-06-02 LAB — URINALYSIS, ROUTINE W REFLEX MICROSCOPIC
Bilirubin Urine: NEGATIVE
Glucose, UA: NEGATIVE mg/dL
Hgb urine dipstick: NEGATIVE
Ketones, ur: NEGATIVE mg/dL
Leukocytes, UA: NEGATIVE
NITRITE: NEGATIVE
Protein, ur: NEGATIVE mg/dL
SPECIFIC GRAVITY, URINE: 1.026 (ref 1.005–1.030)
Urobilinogen, UA: 1 mg/dL (ref 0.0–1.0)
pH: 7 (ref 5.0–8.0)

## 2014-06-02 LAB — COMPREHENSIVE METABOLIC PANEL
ALK PHOS: 36 U/L — AB (ref 39–117)
ALT: 16 U/L (ref 0–35)
AST: 18 U/L (ref 0–37)
Albumin: 3.9 g/dL (ref 3.5–5.2)
Anion gap: 4 — ABNORMAL LOW (ref 5–15)
BILIRUBIN TOTAL: 0.3 mg/dL (ref 0.3–1.2)
BUN: 11 mg/dL (ref 6–23)
CO2: 25 mmol/L (ref 19–32)
Calcium: 8.7 mg/dL (ref 8.4–10.5)
Chloride: 108 mmol/L (ref 96–112)
Creatinine, Ser: 0.58 mg/dL (ref 0.50–1.10)
GFR calc Af Amer: >90 mL/min (ref >90)
GFR calc non Af Amer: >90 mL/min (ref >90)
GLUCOSE: 56 mg/dL — AB (ref 70–99)
POTASSIUM: 4.1 mmol/L (ref 3.5–5.1)
Sodium: 137 mmol/L (ref 135–145)
Total Protein: 7.2 g/dL (ref 6.0–8.3)

## 2014-06-02 LAB — WET PREP, GENITAL
CLUE CELLS WET PREP: NONE SEEN
Trich, Wet Prep: NONE SEEN
WBC WET PREP: NONE SEEN
Yeast Wet Prep HPF POC: NONE SEEN

## 2014-06-02 LAB — CBC
HCT: 39.8 % (ref 36.0–46.0)
HEMOGLOBIN: 12.4 g/dL (ref 12.0–15.0)
MCH: 28.4 pg (ref 26.0–34.0)
MCHC: 31.2 g/dL (ref 30.0–36.0)
MCV: 91.3 fL (ref 78.0–100.0)
PLATELETS: 199 10*3/uL (ref 150–400)
RBC: 4.36 MIL/uL (ref 3.87–5.11)
RDW: 13 % (ref 11.5–15.5)
WBC: 8 10*3/uL (ref 4.0–10.5)

## 2014-06-02 LAB — PREGNANCY, URINE: PREG TEST UR: NEGATIVE

## 2014-06-02 MED ORDER — LIDOCAINE HCL (PF) 1 % IJ SOLN
5.0000 mL | Freq: Once | INTRAMUSCULAR | Status: DC
  Filled 2014-06-02: qty 5

## 2014-06-02 MED ORDER — HYDROCODONE-ACETAMINOPHEN 5-325 MG PO TABS
1.0000 | ORAL_TABLET | Freq: Once | ORAL | Status: AC
  Administered 2014-06-02: 1 via ORAL
  Filled 2014-06-02: qty 1

## 2014-06-02 MED ORDER — SULFAMETHOXAZOLE-TRIMETHOPRIM 800-160 MG PO TABS: 1.0000 | ORAL_TABLET | Freq: Two times a day (BID) | ORAL | Status: AC

## 2014-06-02 NOTE — ED Notes (Signed)
Pt c/o pain with urination, states feels like "glass cutting her when she voids", also remarks of a white odorless discharge, hx of UTI's, denies new sex partners states she has same one 4 months. Denies fever and chills, endorses costovertebral angle pain 6/10.

## 2014-06-02 NOTE — Discharge Instructions (Signed)
Bartholin's Cyst or Abscess °Bartholin's glands are small glands located within the folds of skin (labia) along the sides of the lower opening of the vagina (birth canal). A cyst may develop when the duct of the gland becomes blocked. When this happens, fluid that accumulates within the cyst can become infected. This is known as an abscess. The Bartholin gland produces a mucous fluid to lubricate the outside of the vagina during sexual intercourse. °SYMPTOMS  °· Patients with a small cyst may not have any symptoms. °· Mild discomfort to severe pain depending on the size of the cyst and if it is infected (abscess). °· Pain, redness, and swelling around the lower opening of the vagina. °· Painful intercourse. °· Pressure in the perineal area. °· Swelling of the lips of the vagina (labia). °· The cyst or abscess can be on one side or both sides of the vagina. °DIAGNOSIS  °· A large swelling is seen in the lower vagina area by your caregiver. °· Painful to touch. °· Redness and pain, if it is an abscess. °TREATMENT  °· Sometimes the cyst will go away on its own. °· Apply warm wet compresses to the area or take hot sitz baths several times a day. °· An incision to drain the cyst or abscess with local anesthesia. °· Culture the pus, if it is an abscess. °· Antibiotic treatment, if it is an abscess. °· Cut open the gland and suture the edges to make the opening of the gland bigger (marsupialization). °· Remove the whole gland if the cyst or abscess returns. °PREVENTION  °· Practice good hygiene. °· Clean the vaginal area with a mild soap and soft cloth when bathing. °· Do not rub hard in the vaginal area when bathing. °· Protect the crotch area with a padded cushion if you take long bike rides or ride horses. °· Be sure you are well lubricated when you have sexual intercourse. °HOME CARE INSTRUCTIONS  °· If your cyst or abscess was opened, a small piece of gauze, or a drain, may have been placed in the wound to allow  drainage. Do not remove this gauze or drain unless directed by your caregiver. °· Wear feminine pads, not tampons, as needed for any drainage or bleeding. °· If antibiotics were prescribed, take them exactly as directed. Finish the entire course. °· Only take over-the-counter or prescription medicines for pain, discomfort, or fever as directed by your caregiver. °SEEK IMMEDIATE MEDICAL CARE IF:  °· You have an increase in pain, redness, swelling, or drainage. °· You have bleeding from the wound which results in the use of more than the number of pads suggested by your caregiver in 24 hours. °· You have chills. °· You have a fever. °· You develop any new problems (symptoms) or aggravation of your existing condition. °MAKE SURE YOU:  °· Understand these instructions. °· Will watch your condition. °· Will get help right away if you are not doing well or get worse. °Document Released: 03/08/2005 Document Revised: 05/31/2011 Document Reviewed: 10/25/2007 °[employer]® Patient Information ©2015 [employer]. This information is not intended to replace advice given to you by your health care provider. Make sure you discuss any questions you have with your health care provider. ° °

## 2014-06-02 NOTE — ED Provider Notes (Signed)
CSN: [id number]     Arrival date & time 06/02/14  1629 History   First MD Initiated Contact with Patient 06/02/14 1829     Chief Complaint  Patient presents with  . Dysuria   Anna Gregory is a 29 y.o. female with a history of PID, chlamydia, and trichomonas presents the emergency department complaining of dysuria for the past 2 days associated with some bilateral low back pain and white vaginal discharge. She put she feels like she has a UTI but worse. She reports feeling like she must strain to urinate. She rates her pain at 6/10. She has taken nothing for pain today. She also reports noticing mass on her labia. She pushed her last cycle was 2 weeks ago and was normal. Patient is sexually active and uses condoms. The patient denies fevers, chills, abdominal pain, nausea, vomiting, constipation, diarrhea, urinary urgency, urinary frequency, hematuria, vaginal bleeding or rashes.   (Consider location/radiation/quality/duration/timing/severity/associated sxs/prior Treatment) HPI  Past Medical History  Diagnosis Date  . PONV (postoperative nausea and vomiting)   . Depression   . Urinary tract infection   . Chlamydia   . Trichomonas    Past Surgical History  Procedure Laterality Date  . Cesarean section    . Tubal ligation  01/10/2011    Procedure: POST PARTUM TUBAL LIGATION;  Surgeon: Catalina Antigua, MD;  Location: [facility];  Service: Gynecology;  Laterality: Bilateral;  Bilateral post partum tubal ligation.   Family History  Problem Relation Age of Onset  . Hypertension Maternal Grandmother   . Diabetes Paternal Grandfather    History  Substance Use Topics  . Smoking status: Current Some Day Smoker -- 0.25 packs/day for 3 years  . Smokeless tobacco: Never Used  . Alcohol Use: No   OB History    Gravida Para Term Preterm AB TAB SAB Ectopic Multiple Living   0 Review of Systems  Constitutional: Negative for fever and chills.  HENT: Negative for  congestion, mouth sores and sore throat.   Eyes: Negative for visual disturbance.  Respiratory: Negative for cough, shortness of breath and wheezing.   Cardiovascular: Negative for chest pain and palpitations.  Gastrointestinal: Negative for nausea, vomiting, abdominal pain and diarrhea.  Genitourinary: Positive for dysuria, vaginal discharge, genital sores and vaginal pain. Negative for urgency, frequency, hematuria and vaginal bleeding.  Musculoskeletal: Negative for back pain and neck pain.  Skin: Negative for rash.  Neurological: Negative for light-headedness and headaches.      Allergies  Strawberry and Aspirin  Home Medications   Prior to Admission medications   Medication Sig Start Date End Date Taking? Authorizing Provider  Multiple Vitamins-Minerals (MULTIVITAMIN & MINERAL PO) Take 1 tablet by mouth daily.   Yes Historical Provider, MD  oxyCODONE-acetaminophen (PERCOCET) 5-325 MG per tablet Take 1 tablet by mouth every 4 (four) hours as needed. Patient not taking: Reported on 06/02/2014 06/23/13   Loren Racer, MD  sulfamethoxazole-trimethoprim (BACTRIM DS,SEPTRA DS) 800-160 MG per tablet Take 1 tablet by mouth 2 (two) times daily. 06/02/14 06/09/14  Everlene Farrier, PA-C   BP 107/67 mmHg  Pulse 63  Temp(Src) 98 F (36.7 C) (Oral)  Resp 18  SpO2 100% Physical Exam  Constitutional: She appears well-developed and well-nourished. No distress.  HENT:  Head: Normocephalic and atraumatic.  Mouth/Throat: Oropharynx is clear and moist. No oropharyngeal exudate.  Eyes: Conjunctivae are normal. Pupils are equal, round, and reactive to light. Right eye  exhibits no discharge. Left eye exhibits no discharge.  Neck: Neck supple.  Cardiovascular: Normal rate, regular rhythm, normal heart sounds and intact distal pulses.  Exam reveals no gallop and no friction rub.   No murmur heard. Pulmonary/Chest: Effort normal and breath sounds normal. No respiratory distress. She has no wheezes.  She has no rales.  Abdominal: Soft. Bowel sounds are normal. She exhibits no distension and no mass. There is no tenderness. There is no rebound and no guarding.  Abdomen is soft and nontender to palpation. Bowel sounds are present.  Genitourinary:  Pelvic exam performed by me with female nurse chaperone. The patient has a Bartholin cyst to her left labia majora. The patient is a normal amount of clear vaginal discharge. There is no cervical motion tenderness. No bleeding. No adnexal tenderness or fullness noted.  Musculoskeletal: She exhibits no edema.  Lymphadenopathy:    She has no cervical adenopathy.  Neurological: She is alert. Coordination normal.  Skin: Skin is warm and dry. No rash noted. She is not diaphoretic. No erythema. No pallor.  Psychiatric: She has a normal mood and affect. Her behavior is normal.  Nursing note and vitals reviewed.   ED Course  INCISION AND DRAINAGE Date/Time: 06/02/2014 9:00 PM Performed by: Everlene Farrier Authorized by: Everlene Farrier Consent: Verbal consent obtained. Risks and benefits: risks, benefits and alternatives were discussed Consent given by: patient Patient understanding: patient states understanding of the procedure being performed Patient consent: the patient's understanding of the procedure matches consent given Procedure consent: procedure consent matches procedure scheduled Relevant documents: relevant documents present and verified Site marked: the operative site was marked Required items: required blood products, implants, devices, and special equipment available Patient identity confirmed: verbally with patient Time out: Immediately prior to procedure a "time out" was called to verify the correct patient, procedure, equipment, support staff and site/side marked as required. Indications for incision and drainage: Bartholin cyst  Body area: anogenital Location details: Bartholin's gland Anesthesia: local infiltration Local  anesthetic: lidocaine 1% without epinephrine Anesthetic total: 1 ml Patient sedated: no Scalpel size: 11 Incision type: single straight Complexity: simple Drainage: serosanguinous Drainage amount: scant Wound treatment: wound left open Packing material: none Patient tolerance: Patient tolerated the procedure well with no immediate complications   (including critical care time) Labs Review Labs Reviewed  COMPREHENSIVE METABOLIC PANEL - Abnormal; Notable for the following:    Glucose, Bld 56 (*)    Alkaline Phosphatase 36 (*)    Anion gap 4 (*)    All other components within normal limits  WET PREP, GENITAL  URINALYSIS, ROUTINE W REFLEX MICROSCOPIC  CBC  PREGNANCY, URINE  HIV ANTIBODY (ROUTINE TESTING)  RPR  GC/CHLAMYDIA PROBE AMP ([place])    Imaging Review No results found.   EKG Interpretation None      Filed Vitals:   06/02/14 1640 06/02/14 1945 06/02/14 2136  BP: 114/55 110/63 107/67  Pulse: 82 80 63  Temp: 98 F (36.7 C)    TempSrc: Oral    Resp: SpO2: 100% 100% 100%     MDM   Meds given in ED:  Medications  HYDROcodone-acetaminophen (NORCO/VICODIN) 5-325 MG per tablet 1 tablet (1 tablet Oral Given 06/02/14 1946)    Discharge Medication List as of 06/02/2014 10:32 PM    START taking these medications   Details  sulfamethoxazole-trimethoprim (BACTRIM DS,SEPTRA DS) 800-160 MG per tablet Take 1 tablet by mouth 2 (two) times daily., Starting 06/02/2014, Until Sun 06/09/14, Print  Final diagnoses:  Bartholin cyst   This is a 29 year old female presents him return complaining of dysuria, vaginal discharge and a painful area in her vagina. The patient is afebrile and nontoxic appearing. Patient's CBC and CMP are unremarkable. She is a negative urine pregnancy test. Urinalysis negative for infection. Her on pelvic exam she has a Bartholin's cyst to her left labia majora. Wet prep is unremarkable. Incision and drainage performed by me  and tolerated well by the patient. I advised patient she needs to follow-up with the women's outpatient clinic this week.I advised the patient to return to the emergency department with new or worsening symptoms or new concerns. The patient verbalized understanding and agreement with plan.      Everlene FarrierWilliam Ramia Sidney, PA-C 06/03/14 0300  Arby BarretteMarcy Pfeiffer, MD 06/08/14 1037

## 2014-06-03 LAB — HIV ANTIBODY (ROUTINE TESTING W REFLEX): HIV Screen 4th Generation wRfx: NONREACTIVE

## 2014-06-03 LAB — GC/CHLAMYDIA PROBE AMP (~~LOC~~) NOT AT ARMC
Chlamydia: NEGATIVE
Neisseria Gonorrhea: NEGATIVE

## 2014-06-03 LAB — RPR: RPR Ser Ql: NONREACTIVE

## 2014-08-26 DIAGNOSIS — Z9889 Other specified postprocedural states: Secondary | ICD-10-CM | POA: Insufficient documentation

## 2014-08-26 DIAGNOSIS — Z3202 Encounter for pregnancy test, result negative: Secondary | ICD-10-CM | POA: Insufficient documentation

## 2014-08-26 DIAGNOSIS — R1030 Lower abdominal pain, unspecified: Secondary | ICD-10-CM | POA: Insufficient documentation

## 2014-08-26 DIAGNOSIS — Z87440 Personal history of urinary (tract) infections: Secondary | ICD-10-CM | POA: Insufficient documentation

## 2014-08-26 DIAGNOSIS — Z8659 Personal history of other mental and behavioral disorders: Secondary | ICD-10-CM | POA: Insufficient documentation

## 2014-08-26 DIAGNOSIS — Z8619 Personal history of other infectious and parasitic diseases: Secondary | ICD-10-CM | POA: Insufficient documentation

## 2014-08-26 DIAGNOSIS — Z720 Tobacco use: Secondary | ICD-10-CM | POA: Insufficient documentation

## 2014-08-26 DIAGNOSIS — Z9851 Tubal ligation status: Secondary | ICD-10-CM | POA: Insufficient documentation

## 2014-08-27 ENCOUNTER — Encounter (HOSPITAL_COMMUNITY): Payer: Self-pay | Admitting: Emergency Medicine

## 2014-08-27 ENCOUNTER — Emergency Department (HOSPITAL_COMMUNITY): Payer: Medicaid Other

## 2014-08-27 ENCOUNTER — Emergency Department (HOSPITAL_COMMUNITY)
Admission: EM | Admit: 2014-08-27 | Discharge: 2014-08-27 | Disposition: A | Discharge status: Home / Self Care | Payer: Self-pay | Attending: Emergency Medicine | Admitting: Emergency Medicine

## 2014-08-27 DIAGNOSIS — R109 Unspecified abdominal pain: Secondary | ICD-10-CM

## 2014-08-27 LAB — COMPREHENSIVE METABOLIC PANEL
ALBUMIN: 3.6 g/dL (ref 3.5–5.0)
ALT: 28 U/L (ref 14–54)
AST: 21 U/L (ref 15–41)
Alkaline Phosphatase: 34 U/L — ABNORMAL LOW (ref 38–126)
Anion gap: 8 (ref 5–15)
BUN: 10 mg/dL (ref 6–20)
CO2: 25 mmol/L (ref 22–32)
CREATININE: 0.42 mg/dL — AB (ref 0.44–1.00)
Calcium: 8.9 mg/dL (ref 8.9–10.3)
Chloride: 106 mmol/L (ref 101–111)
GFR calc non Af Amer: >60 mL/min (ref >60)
GLUCOSE: 119 mg/dL — AB (ref 65–99)
POTASSIUM: 3.3 mmol/L — AB (ref 3.5–5.1)
SODIUM: 139 mmol/L (ref 135–145)
Total Bilirubin: 0.1 mg/dL — ABNORMAL LOW (ref 0.3–1.2)
Total Protein: 6.9 g/dL (ref 6.5–8.1)

## 2014-08-27 LAB — CBC WITH DIFFERENTIAL/PLATELET
BASOS PCT: 1 % (ref 0–1)
Basophils Absolute: 0.1 10*3/uL (ref 0.0–0.1)
EOS PCT: 7 % — AB (ref 0–5)
Eosinophils Absolute: 0.6 10*3/uL (ref 0.0–0.7)
HEMATOCRIT: 32.7 % — AB (ref 36.0–46.0)
HEMOGLOBIN: 10.8 g/dL — AB (ref 12.0–15.0)
Lymphocytes Relative: 30 % (ref 12–46)
Lymphs Abs: 2.5 10*3/uL (ref 0.7–4.0)
MCH: 29.3 pg (ref 26.0–34.0)
MCHC: 33 g/dL (ref 30.0–36.0)
MCV: 88.9 fL (ref 78.0–100.0)
MONO ABS: 0.6 10*3/uL (ref 0.1–1.0)
Monocytes Relative: 7 % (ref 3–12)
Neutro Abs: 4.5 10*3/uL (ref 1.7–7.7)
Neutrophils Relative %: 55 % (ref 43–77)
Platelets: 177 10*3/uL (ref 150–400)
RBC: 3.68 MIL/uL — ABNORMAL LOW (ref 3.87–5.11)
RDW: 12.8 % (ref 11.5–15.5)
WBC: 8.3 10*3/uL (ref 4.0–10.5)

## 2014-08-27 LAB — URINALYSIS, ROUTINE W REFLEX MICROSCOPIC
BILIRUBIN URINE: NEGATIVE
Glucose, UA: NEGATIVE mg/dL
Ketones, ur: NEGATIVE mg/dL
LEUKOCYTES UA: NEGATIVE
Nitrite: POSITIVE — AB
PROTEIN: NEGATIVE mg/dL
Specific Gravity, Urine: 1.029 (ref 1.005–1.030)
Urobilinogen, UA: 1 mg/dL (ref 0.0–1.0)
pH: 6 (ref 5.0–8.0)

## 2014-08-27 LAB — POC URINE PREG, ED: Preg Test, Ur: NEGATIVE

## 2014-08-27 LAB — LIPASE, BLOOD: LIPASE: 24 U/L (ref 22–51)

## 2014-08-27 LAB — URINE MICROSCOPIC-ADD ON

## 2014-08-27 MED ORDER — DIPHENHYDRAMINE HCL 50 MG/ML IJ SOLN
25.0000 mg | Freq: Once | INTRAMUSCULAR | Status: AC
  Administered 2014-08-27: 25 mg via INTRAVENOUS
  Filled 2014-08-27: qty 1

## 2014-08-27 MED ORDER — OXYCODONE-ACETAMINOPHEN 5-325 MG PO TABS: 1.0000 | ORAL_TABLET | Freq: Four times a day (QID) | ORAL | Status: DC | PRN

## 2014-08-27 MED ORDER — HYDROMORPHONE HCL 1 MG/ML IJ SOLN
1.0000 mg | Freq: Once | INTRAMUSCULAR | Status: AC
  Administered 2014-08-27: 1 mg via INTRAVENOUS
  Filled 2014-08-27: qty 1

## 2014-08-27 MED ORDER — SODIUM CHLORIDE 0.9 % IV SOLN
INTRAVENOUS | Status: DC
  Administered 2014-08-27: 03:00:00 via INTRAVENOUS

## 2014-08-27 NOTE — ED Notes (Signed)
Pt states she's had RLQ pain since Thursday, denies diarrhea, states when it started on Thursday had n/v, but went away, states still having RLQ pain after working today, states she has been drinking "plenty" of water and gatorade today but has had trouble using bathroom, states it hurts when urinating.

## 2014-08-27 NOTE — ED Notes (Signed)
Patient transported to CT 

## 2014-08-27 NOTE — ED Notes (Signed)
Pt itching from dilaudid given

## 2014-08-27 NOTE — ED Provider Notes (Signed)
CSN: [id number]     Arrival date & time 08/26/14  2351 History   First MD Initiated Contact with Patient 08/27/14 0127     Chief Complaint  Patient presents with  . Flank Pain     (Consider location/radiation/quality/duration/timing/severity/associated sxs/prior Treatment) HPI  This is a 29 year old female with a five-day history of right flank pain. The pain is, gradually and is now moderate to severe. It is worse with movement or palpation. It is been associated with fever to 102.8, a syncopal episode 4 days ago at work, nausea and vomiting. She last urinated yesterday morning and is unable to urinate now. She has been drinking a lot of fluids and attempt to void. She has been having suprapubic pain and pain with urination. She has not had any diarrhea. Nursing staff placed a Foley catheter and she has drained about 300 milliliters of urine with partial relief of her suprapubic discomfort.  Past Medical History  Diagnosis Date  . PONV (postoperative nausea and vomiting)   . Depression   . Urinary tract infection   . Chlamydia   . Trichomonas    Past Surgical History  Procedure Laterality Date  . Cesarean section    . Tubal ligation  01/10/2011    Procedure: POST PARTUM TUBAL LIGATION;  Surgeon: Catalina Antigua, MD;  Location: [facility];  Service: Gynecology;  Laterality: Bilateral;  Bilateral post partum tubal ligation.   Family History  Problem Relation Age of Onset  . Hypertension Maternal Grandmother   . Diabetes Paternal Grandfather    History  Substance Use Topics  . Smoking status: Current Some Day Smoker -- 0.25 packs/day for 3 years  . Smokeless tobacco: Never Used  . Alcohol Use: No   OB History    Gravida Para Term Preterm AB TAB SAB Ectopic Multiple Living   0 Review of Systems  All other systems reviewed and are negative.   Allergies  Strawberry and Aspirin  Home Medications   Prior to Admission medications   Medication Sig Start Date  End Date Taking? Authorizing Provider  oxyCODONE-acetaminophen (PERCOCET) 5-325 MG per tablet Take 1 tablet by mouth every 4 (four) hours as needed. Patient not taking: Reported on 06/02/2014 06/23/13   Loren Racer, MD   BP 128/72 mmHg  Pulse 75  Temp(Src) 98.3 F (36.8 C) (Oral)  Resp 18  SpO2 100%  LMP 08/20/2014 (Exact Date)   Physical Exam  General: Well-developed, well-nourished female in no acute distress; appearance consistent with age of record HENT: normocephalic; atraumatic Eyes: pupils equal, round and reactive to light; extraocular muscles intact Neck: supple Heart: regular rate and rhythm Lungs: clear to auscultation bilaterally Abdomen: soft; nondistended; suprapubic tenderness; no masses or hepatosplenomegaly; bowel sounds present GU: Right CVA tenderness; Foley catheter in place draining dark urine Extremities: No deformity; full range of motion; pulses normal Neurologic: Awake, alert and oriented; motor function intact in all extremities and symmetric; no facial droop Skin: Warm and dry Psychiatric: Normal mood and affect    ED Course  Procedures (including critical care time)   MDM   Nursing notes and vitals signs, including pulse oximetry, reviewed.  Summary of this visit's results, reviewed by myself:  Labs:  Results for orders placed or performed during the hospital encounter of 08/27/14 (from the past 24 hour(s))  CBC WITH DIFFERENTIAL     Status: Abnormal   Collection Time: 08/27/14  1:03 AM  Result Value  Ref Range   WBC 8.3 4.0 - 10.5 K/uL   RBC 3.68 (L) 3.87 - 5.11 MIL/uL   Hemoglobin 10.8 (L) 12.0 - 15.0 g/dL   HCT 16.132.7 (L) 09.636.0 - 04.546.0 %   MCV 88.9 78.0 - 100.0 fL   MCH 29.3 26.0 - 34.0 pg   MCHC 33.0 30.0 - 36.0 g/dL   RDW 40.912.8 81.111.5 - 91.415.5 %   Platelets 177 150 - 400 K/uL   Neutrophils Relative % 55 43 - 77 %   Lymphocytes Relative 30 12 - 46 %   Monocytes Relative 7 3 - 12 %   Eosinophils Relative 7 (H) 0 - 5 %   Basophils Relative  1 0 - 1 %   Neutro Abs 4.5 1.7 - 7.7 K/uL   Lymphs Abs 2.5 0.7 - 4.0 K/uL   Monocytes Absolute 0.6 0.1 - 1.0 K/uL   Eosinophils Absolute 0.6 0.0 - 0.7 K/uL   Basophils Absolute 0.1 0.0 - 0.1 K/uL   Smear Review MORPHOLOGY UNREMARKABLE   Comprehensive metabolic panel     Status: Abnormal   Collection Time: 08/27/14  1:03 AM  Result Value Ref Range   Sodium 139 135 - 145 mmol/L   Potassium 3.3 (L) 3.5 - 5.1 mmol/L   Chloride 106 101 - 111 mmol/L   CO2 25 22 - 32 mmol/L   Glucose, Bld 119 (H) 65 - 99 mg/dL   BUN 10 6 - 20 mg/dL   Creatinine, Ser 7.820.42 (L) 0.44 - 1.00 mg/dL   Calcium 8.9 8.9 - 95.610.3 mg/dL   Total Protein 6.9 6.5 - 8.1 g/dL   Albumin 3.6 3.5 - 5.0 g/dL   AST 21 15 - 41 U/L   ALT 28 14 - 54 U/L   Alkaline Phosphatase 34 (L) 38 - 126 U/L   Total Bilirubin 0.1 (L) 0.3 - 1.2 mg/dL   GFR calc non Af Amer >60 >60 mL/min   GFR calc Af Amer >60 >60 mL/min   Anion gap 8 5 - 15  Lipase, blood     Status: None   Collection Time: 08/27/14  1:03 AM  Result Value Ref Range   Lipase 24 22 - 51 U/L  Urinalysis, Routine w reflex microscopic (not at [facility])     Status: Abnormal   Collection Time: 08/27/14  1:36 AM  Result Value Ref Range   Color, Urine YELLOW YELLOW   APPearance CLOUDY (A) CLEAR   Specific Gravity, Urine 1.029 1.005 - 1.030   pH 6.0 5.0 - 8.0   Glucose, UA NEGATIVE NEGATIVE mg/dL   Hgb urine dipstick TRACE (A) NEGATIVE   Bilirubin Urine NEGATIVE NEGATIVE   Ketones, ur NEGATIVE NEGATIVE mg/dL   Protein, ur NEGATIVE NEGATIVE mg/dL   Urobilinogen, UA 1.0 0.0 - 1.0 mg/dL   Nitrite POSITIVE (A) NEGATIVE   Leukocytes, UA NEGATIVE NEGATIVE  Urine microscopic-add on     Status: Abnormal   Collection Time: 08/27/14  1:36 AM  Result Value Ref Range   WBC, UA 0-2 <3 WBC/hpf   Bacteria, UA MANY (A) RARE  POC Urine Preg, ED (not [facility])     Status: None   Collection Time: 08/27/14  1:43 AM  Result Value Ref Range   Preg Test, Ur NEGATIVE NEGATIVE    Imaging  Studies: Ct Renal Stone Study  08/27/2014   CLINICAL DATA:  Acute onset of right flank pain. Right lower quadrant pain, nausea and vomiting. Painful urination.  EXAM: CT ABDOMEN AND PELVIS  WITHOUT CONTRAST  TECHNIQUE: Multidetector CT imaging of the abdomen and pelvis was performed following the standard protocol without IV contrast.  COMPARISON:  None.  FINDINGS: The included lung bases are clear.  There is perinephric stranding about the right kidney without hydronephrosis. No ureteral calculi. No left hydronephrosis or urolithiasis. The urinary bladder is decompressed by Foley catheter not assessed.  Evaluation of the remaining solid and hollow viscera is limited given lack of contrast. The unenhanced liver, gallbladder, spleen, adrenal glands, and pancreas are unremarkable.  Stomach is physiologically distended. There are no dilated or thickened bowel loops. The appendix is normal. Small volume of colonic stool without colonic wall thickening. No free air, free fluid, or intra-abdominal fluid collection.  No retroperitoneal adenopathy. Abdominal aorta is normal in caliber. Multiple retroperitoneal phleboliths.  Within the pelvis the uterus and adnexa are grossly normal, suboptimally defined without contrast. There are multiple pelvic phleboliths. No pelvic free fluid. Prominent lymph nodes in the inguinal regions, likely reactive.  There are no acute or suspicious osseous abnormalities.  IMPRESSION: Right perinephric stranding without hydronephrosis. Findings may reflect recently passed stone versus urinary tract infection. No urolithiasis.   Electronically Signed   By: Rubye Oaks M.D.   On: 08/27/2014 03:13   6:43 AM Patient able to void after removal of Foley catheter. Her urinalysis is not consistent with an acute urinary tract infection or pyelonephritis. Given CT findings is likely she had a passed stone with residual spasm in her ureter and bladder. We will discharge her home and advised her that  should she become unable to void again she should return.    Paula Libra, MD 08/27/14 [phone]

## 2014-08-27 NOTE — ED Notes (Signed)
Pt was able to void "a little bit", states it did not hurt as bad this time.

## 2014-08-27 NOTE — ED Notes (Signed)
Bladder scan done, showed 400 ml in bladder, pt did attempt to void in restroom, had drop in cup and pt stated it hurts too bad when straining. EDP made aware.

## 2014-08-27 NOTE — ED Notes (Signed)
Pt states she has been having right flank pain on Thursday  Pt states on Friday the pain was worse and she had nausea and vomiting  Pt states she had a syncopal episode at work Friday and EMS came to her job  Pt states she has been ok for the weekend but today the pain has returned and is worse  Pt states she voided this morning but has not been able to void since

## 2015-06-06 ENCOUNTER — Encounter (HOSPITAL_COMMUNITY): Payer: Self-pay | Admitting: Emergency Medicine

## 2015-06-06 ENCOUNTER — Emergency Department (HOSPITAL_COMMUNITY): Payer: Self-pay

## 2015-06-06 ENCOUNTER — Emergency Department (HOSPITAL_COMMUNITY)
Admission: EM | Admit: 2015-06-06 | Discharge: 2015-06-06 | Disposition: A | Discharge status: Home / Self Care | Payer: Self-pay | Attending: Emergency Medicine | Admitting: Emergency Medicine

## 2015-06-06 DIAGNOSIS — Z8659 Personal history of other mental and behavioral disorders: Secondary | ICD-10-CM | POA: Insufficient documentation

## 2015-06-06 DIAGNOSIS — Z87440 Personal history of urinary (tract) infections: Secondary | ICD-10-CM | POA: Insufficient documentation

## 2015-06-06 DIAGNOSIS — B349 Viral infection, unspecified: Secondary | ICD-10-CM | POA: Insufficient documentation

## 2015-06-06 LAB — CBC
HEMATOCRIT: 37.6 % (ref 36.0–46.0)
Hemoglobin: 12.3 g/dL (ref 12.0–15.0)
MCH: 29.2 pg (ref 26.0–34.0)
MCHC: 32.7 g/dL (ref 30.0–36.0)
MCV: 89.3 fL (ref 78.0–100.0)
Platelets: 154 10*3/uL (ref 150–400)
RBC: 4.21 MIL/uL (ref 3.87–5.11)
RDW: 13.1 % (ref 11.5–15.5)
WBC: 4.7 10*3/uL (ref 4.0–10.5)

## 2015-06-06 LAB — URINALYSIS, ROUTINE W REFLEX MICROSCOPIC
Bilirubin Urine: NEGATIVE
Glucose, UA: NEGATIVE mg/dL
KETONES UR: 40 mg/dL — AB
LEUKOCYTES UA: NEGATIVE
NITRITE: POSITIVE — AB
PH: 6 (ref 5.0–8.0)
Protein, ur: NEGATIVE mg/dL
Specific Gravity, Urine: 1.027 (ref 1.005–1.030)

## 2015-06-06 LAB — URINE MICROSCOPIC-ADD ON

## 2015-06-06 LAB — COMPREHENSIVE METABOLIC PANEL
ALT: 18 U/L (ref 14–54)
AST: 22 U/L (ref 15–41)
Albumin: 4.4 g/dL (ref 3.5–5.0)
Alkaline Phosphatase: 35 U/L — ABNORMAL LOW (ref 38–126)
Anion gap: 9 (ref 5–15)
BUN: 6 mg/dL (ref 6–20)
CO2: 21 mmol/L — ABNORMAL LOW (ref 22–32)
Calcium: 9.1 mg/dL (ref 8.9–10.3)
Chloride: 105 mmol/L (ref 101–111)
Creatinine, Ser: 0.66 mg/dL (ref 0.44–1.00)
GFR calc Af Amer: >60 mL/min (ref >60)
Glucose, Bld: 98 mg/dL (ref 65–99)
POTASSIUM: 3.5 mmol/L (ref 3.5–5.1)
Sodium: 135 mmol/L (ref 135–145)
TOTAL PROTEIN: 8 g/dL (ref 6.5–8.1)
Total Bilirubin: 0.5 mg/dL (ref 0.3–1.2)

## 2015-06-06 LAB — LIPASE, BLOOD: Lipase: 22 U/L (ref 11–51)

## 2015-06-06 MED ORDER — ONDANSETRON 4 MG PO TBDP
4.0000 mg | ORAL_TABLET | Freq: Once | ORAL | Status: AC | PRN
  Administered 2015-06-06: 4 mg via ORAL
  Filled 2015-06-06: qty 1

## 2015-06-06 MED ORDER — BENZONATATE 100 MG PO CAPS: 100.0000 mg | ORAL_CAPSULE | Freq: Three times a day (TID) | ORAL | Status: DC

## 2015-06-06 MED ORDER — ACETAMINOPHEN ER 650 MG PO TBCR: 650.0000 mg | EXTENDED_RELEASE_TABLET | Freq: Three times a day (TID) | ORAL | Status: DC | PRN

## 2015-06-06 MED ORDER — ONDANSETRON 8 MG PO TBDP: 8.0000 mg | ORAL_TABLET | Freq: Three times a day (TID) | ORAL | Status: DC | PRN

## 2015-06-06 NOTE — ED Provider Notes (Signed)
CSN: [id number]     Arrival date & time 06/06/15  0130 History   First MD Initiated Contact with Patient 06/06/15 0539     Chief Complaint  Patient presents with  . Cough  . Emesis  . Diarrhea     (Consider location/radiation/quality/duration/timing/severity/associated sxs/prior Treatment) HPI Comments: Pt comes in with cough, nausea, emesis, diarrhea and body aches, runny nose, sore throat. Pt has been sick x 1 day. Pt has no medical hx. Pt couldn't sleep due to cough and nausea - so she came to the ER. Temp at home 100. + midsternal chest pain and abdominal pain with cough. No sick contacts. No visual complains, seizures, altered mental status, loss of consciousness, new weakness, or numbness, no gait instability. No uti like sx. Pt is not pregnant as far as she knows, LMP was end of last month.     Patient is a 30 y.o. female presenting with cough, vomiting, and diarrhea. The history is provided by the patient.  Cough Emesis Associated symptoms: diarrhea   Diarrhea Associated symptoms: vomiting     Past Medical History  Diagnosis Date  . PONV (postoperative nausea and vomiting)   . Depression   . Urinary tract infection   . Chlamydia   . Trichomonas    Past Surgical History  Procedure Laterality Date  . Cesarean section    . Tubal ligation  01/10/2011    Procedure: POST PARTUM TUBAL LIGATION;  Surgeon: Catalina Antigua, MD;  Location: [facility];  Service: Gynecology;  Laterality: Bilateral;  Bilateral post partum tubal ligation.   Family History  Problem Relation Age of Onset  . Hypertension Maternal Grandmother   . Diabetes Paternal Grandfather    Social History  Substance Use Topics  . Smoking status: Current Some Day Smoker -- 0.25 packs/day for 3 years  . Smokeless tobacco: Never Used  . Alcohol Use: No   OB History    Gravida Para Term Preterm AB TAB SAB Ectopic Multiple Living   0 Review of Systems  Respiratory: Positive for cough.    Gastrointestinal: Positive for vomiting and diarrhea.      Allergies  Strawberry extract and Aspirin  Home Medications   Prior to Admission medications   Medication Sig Start Date End Date Taking? Authorizing Provider  acetaminophen (TYLENOL 8 HOUR) 650 MG CR tablet Take 1 tablet (650 mg total) by mouth every 8 (eight) hours as needed for pain. 06/06/15   Derwood Kaplan, MD  benzonatate (TESSALON) 100 MG capsule Take 1 capsule (100 mg total) by mouth every 8 (eight) hours. 06/06/15   Derwood Kaplan, MD  ondansetron (ZOFRAN ODT) 8 MG disintegrating tablet Take 1 tablet (8 mg total) by mouth every 8 (eight) hours as needed for nausea. 06/06/15   Derwood Kaplan, MD  oxyCODONE-acetaminophen (PERCOCET) 5-325 MG per tablet Take 1-2 tablets by mouth every 6 (six) hours as needed (for pain). Patient not taking: Reported on 06/06/2015 08/27/14   John Molpus, MD   BP 110/60 mmHg  Pulse 93  Temp(Src) 100.1 F (37.8 C) (Oral)  Resp 16  SpO2 98%  LMP 05/20/2015 Physical Exam  Constitutional: She is oriented to person, place, and time. She appears well-developed.  HENT:  Head: Normocephalic and atraumatic.  Mouth/Throat: No oropharyngeal exudate.  Eyes: Conjunctivae and EOM are normal.  Neck: Normal range of motion. Neck supple. No tracheal deviation present.  Cardiovascular: Normal rate.   Pulmonary/Chest: Effort normal  and breath sounds normal. No stridor. No respiratory distress. She has no wheezes.  Abdominal: Bowel sounds are normal. There is no tenderness.  Lymphadenopathy:    She has cervical adenopathy.  Neurological: She is alert and oriented to person, place, and time.  Skin: Skin is warm and dry.  Nursing note and vitals reviewed.   ED Course  Procedures (including critical care time) Labs Review Labs Reviewed  COMPREHENSIVE METABOLIC PANEL - Abnormal; Notable for the following:    CO2 21 (*)    Alkaline Phosphatase 35 (*)    All other components within normal limits   URINALYSIS, ROUTINE W REFLEX MICROSCOPIC (NOT AT [facility]) - Abnormal; Notable for the following:    APPearance CLOUDY (*)    Hgb urine dipstick TRACE (*)    Ketones, ur 40 (*)    Nitrite POSITIVE (*)    All other components within normal limits  URINE MICROSCOPIC-ADD ON - Abnormal; Notable for the following:    Squamous Epithelial / LPF 6-30 (*)    Bacteria, UA MANY (*)    All other components within normal limits  LIPASE, BLOOD  CBC    Imaging Review Dg Chest 2 View  06/06/2015  CLINICAL DATA:  Cough, shortness of breath. EXAM: CHEST  2 VIEW COMPARISON:  None. FINDINGS: The heart size and mediastinal contours are within normal limits. Both lungs are clear. No pneumothorax or pleural effusion is noted. The visualized skeletal structures are unremarkable. IMPRESSION: No active cardiopulmonary disease. Electronically Signed   By: Lupita RaiderJames  Green Jr, M.D.   On: 06/06/2015 07:11   I have personally reviewed and evaluated these images and lab results as part of my medical decision-making.   EKG Interpretation None      MDM   Final diagnoses:  Acute viral syndrome    Pt comes in with uri like symptoms. She has a low grade fever at arrival. Her exam is negative for any acute bacterial process, and the triage labs are appearing normal. UA has many bacteria - but pt denies uti like symptoms, vaginal discharge or bleeding. Will treat as a viral syndrome.  Derwood KaplanAnkit Mckinlee Dunk, MD 06/08/15 [phone]

## 2015-06-06 NOTE — ED Notes (Signed)
Patient presents for cough, N/V/D, generalized body aches. Denies fever/chills.

## 2015-06-06 NOTE — Discharge Instructions (Signed)
Viral Infections °A viral infection can be caused by different types of viruses. Most viral infections are not serious and resolve on their own. However, some infections may cause severe symptoms and may lead to further complications. °SYMPTOMS °Viruses can frequently cause: °· Minor sore throat. °· Aches and pains. °· Headaches. °· Runny nose. °· Different types of rashes. °· Watery eyes. °· Tiredness. °· Cough. °· Loss of appetite. °· Gastrointestinal infections, resulting in nausea, vomiting, and diarrhea. °These symptoms do not respond to antibiotics because the infection is not caused by bacteria. However, you might catch a bacterial infection following the viral infection. This is sometimes called a "superinfection." Symptoms of such a bacterial infection may include: °· Worsening sore throat with pus and difficulty swallowing. °· Swollen neck glands. °· Chills and a high or persistent fever. °· Severe headache. °· Tenderness over the sinuses. °· Persistent overall ill feeling (malaise), muscle aches, and tiredness (fatigue). °· Persistent cough. °· Yellow, green, or brown mucus production with coughing. °HOME CARE INSTRUCTIONS  °· Only take over-the-counter or prescription medicines for pain, discomfort, diarrhea, or fever as directed by your caregiver. °· Drink enough water and fluids to keep your urine clear or pale yellow. Sports drinks can provide valuable electrolytes, sugars, and hydration. °· Get plenty of rest and maintain proper nutrition. Soups and broths with crackers or rice are fine. °SEEK IMMEDIATE MEDICAL CARE IF:  °· You have severe headaches, shortness of breath, chest pain, neck pain, or an unusual rash. °· You have uncontrolled vomiting, diarrhea, or you are unable to keep down fluids. °· You or your child has an oral temperature above 102° F (38.9° C), not controlled by medicine. °· Your baby is older than 3 months with a rectal temperature of 102° F (38.9° C) or higher. °· Your baby is 3  months old or younger with a rectal temperature of 100.4° F (38° C) or higher. °MAKE SURE YOU:  °· Understand these instructions. °· Will watch your condition. °· Will get help right away if you are not doing well or get worse. °  °This information is not intended to replace advice given to you by your health care provider. Make sure you discuss any questions you have with your health care provider. °  °Document Released: 12/16/2004 Document Revised: 05/31/2011 Document Reviewed: 08/14/2014 °Elsevier Interactive Patient Education ©2016 [employer] ° °

## 2015-06-06 NOTE — ED Notes (Signed)
Verbalized understanding discharge instructions. In no acute distress.  Pt's boyfriend given a note.

## 2015-12-28 ENCOUNTER — Emergency Department (HOSPITAL_BASED_OUTPATIENT_CLINIC_OR_DEPARTMENT_OTHER)
Admission: EM | Admit: 2015-12-28 | Discharge: 2015-12-28 | Disposition: A | Discharge status: Home / Self Care | Payer: BLUE CROSS/BLUE SHIELD | Attending: Emergency Medicine | Admitting: Emergency Medicine

## 2015-12-28 ENCOUNTER — Encounter (HOSPITAL_BASED_OUTPATIENT_CLINIC_OR_DEPARTMENT_OTHER): Payer: Self-pay | Admitting: Emergency Medicine

## 2015-12-28 DIAGNOSIS — F17200 Nicotine dependence, unspecified, uncomplicated: Secondary | ICD-10-CM | POA: Diagnosis not present

## 2015-12-28 DIAGNOSIS — Z79899 Other long term (current) drug therapy: Secondary | ICD-10-CM | POA: Insufficient documentation

## 2015-12-28 DIAGNOSIS — N390 Urinary tract infection, site not specified: Secondary | ICD-10-CM | POA: Insufficient documentation

## 2015-12-28 DIAGNOSIS — R109 Unspecified abdominal pain: Secondary | ICD-10-CM | POA: Diagnosis present

## 2015-12-28 LAB — PREGNANCY, URINE: Preg Test, Ur: NEGATIVE

## 2015-12-28 LAB — CBC WITH DIFFERENTIAL/PLATELET
BASOS ABS: 0 10*3/uL (ref 0.0–0.1)
Basophils Relative: 0 %
Eosinophils Absolute: 0.7 10*3/uL (ref 0.0–0.7)
Eosinophils Relative: 6 %
HEMATOCRIT: 34.5 % — AB (ref 36.0–46.0)
Hemoglobin: 11.4 g/dL — ABNORMAL LOW (ref 12.0–15.0)
LYMPHS PCT: 20 %
Lymphs Abs: 2.2 10*3/uL (ref 0.7–4.0)
MCH: 29.1 pg (ref 26.0–34.0)
MCHC: 33 g/dL (ref 30.0–36.0)
MCV: 88 fL (ref 78.0–100.0)
MONOS PCT: 6 %
Monocytes Absolute: 0.7 10*3/uL (ref 0.1–1.0)
NEUTROS ABS: 7.4 10*3/uL (ref 1.7–7.7)
Neutrophils Relative %: 68 %
PLATELETS: 209 10*3/uL (ref 150–400)
RBC: 3.92 MIL/uL (ref 3.87–5.11)
RDW: 13 % (ref 11.5–15.5)
WBC: 11 10*3/uL — ABNORMAL HIGH (ref 4.0–10.5)

## 2015-12-28 LAB — COMPREHENSIVE METABOLIC PANEL
ALT: 19 U/L (ref 14–54)
AST: 16 U/L (ref 15–41)
Albumin: 3.9 g/dL (ref 3.5–5.0)
Alkaline Phosphatase: 28 U/L — ABNORMAL LOW (ref 38–126)
Anion gap: 6 (ref 5–15)
BILIRUBIN TOTAL: 0.5 mg/dL (ref 0.3–1.2)
BUN: 10 mg/dL (ref 6–20)
CO2: 25 mmol/L (ref 22–32)
CREATININE: 0.53 mg/dL (ref 0.44–1.00)
Calcium: 8.8 mg/dL — ABNORMAL LOW (ref 8.9–10.3)
Chloride: 106 mmol/L (ref 101–111)
GFR calc Af Amer: >60 mL/min (ref >60)
Glucose, Bld: 90 mg/dL (ref 65–99)
POTASSIUM: 3.5 mmol/L (ref 3.5–5.1)
Sodium: 137 mmol/L (ref 135–145)
TOTAL PROTEIN: 6.9 g/dL (ref 6.5–8.1)

## 2015-12-28 LAB — URINE MICROSCOPIC-ADD ON: RBC / HPF: NONE SEEN RBC/hpf (ref 0–5)

## 2015-12-28 LAB — URINALYSIS, ROUTINE W REFLEX MICROSCOPIC
BILIRUBIN URINE: NEGATIVE
GLUCOSE, UA: NEGATIVE mg/dL
HGB URINE DIPSTICK: NEGATIVE
KETONES UR: NEGATIVE mg/dL
Nitrite: POSITIVE — AB
PH: 7 (ref 5.0–8.0)
PROTEIN: NEGATIVE mg/dL
Specific Gravity, Urine: 1.025 (ref 1.005–1.030)

## 2015-12-28 LAB — LIPASE, BLOOD: LIPASE: 49 U/L (ref 11–51)

## 2015-12-28 MED ORDER — SODIUM CHLORIDE 0.9 % IV SOLN
INTRAVENOUS | Status: DC
  Administered 2015-12-28: 10:00:00 via INTRAVENOUS

## 2015-12-28 MED ORDER — DIPHENHYDRAMINE HCL 25 MG PO CAPS
25.0000 mg | ORAL_CAPSULE | Freq: Once | ORAL | Status: AC
  Administered 2015-12-28: 25 mg via ORAL

## 2015-12-28 MED ORDER — SODIUM CHLORIDE 0.9 % IV BOLUS (SEPSIS)
1000.0000 mL | Freq: Once | INTRAVENOUS | Status: AC
  Administered 2015-12-28: 1000 mL via INTRAVENOUS

## 2015-12-28 MED ORDER — ONDANSETRON HCL 4 MG/2ML IJ SOLN
4.0000 mg | Freq: Once | INTRAMUSCULAR | Status: AC
  Administered 2015-12-28: 4 mg via INTRAVENOUS
  Filled 2015-12-28: qty 2

## 2015-12-28 MED ORDER — HYDROMORPHONE HCL 1 MG/ML IJ SOLN
0.5000 mg | INTRAMUSCULAR | Status: DC | PRN
  Administered 2015-12-28: 0.5 mg via INTRAVENOUS
  Filled 2015-12-28: qty 1

## 2015-12-28 MED ORDER — PHENAZOPYRIDINE HCL 200 MG PO TABS: 200.0000 mg | ORAL_TABLET | Freq: Three times a day (TID) | ORAL | 0 refills | Status: DC

## 2015-12-28 MED ORDER — CEPHALEXIN 500 MG PO CAPS: 500.0000 mg | ORAL_CAPSULE | Freq: Four times a day (QID) | ORAL | 0 refills | Status: DC

## 2015-12-28 MED ORDER — CEPHALEXIN 250 MG PO CAPS
500.0000 mg | ORAL_CAPSULE | Freq: Once | ORAL | Status: AC
  Administered 2015-12-28: 500 mg via ORAL
  Filled 2015-12-28: qty 2

## 2015-12-28 MED ORDER — DIPHENHYDRAMINE HCL 25 MG PO CAPS
ORAL_CAPSULE | ORAL | Status: AC
  Filled 2015-12-28: qty 1

## 2015-12-28 NOTE — ED Provider Notes (Signed)
[facility]   CSN: [id number] Arrival date & time: 12/28/15  0910     History   Chief Complaint Chief Complaint  Patient presents with  . Flank Pain    HPI Anna Gregory is a 30 y.o. female.  Pt noticed urinary urgency starting a few days ago.  Since then she started having pain in the right flank.  One episode of vomiting last night but no trouble with appetite or eating.   No diarrhea or constipation.  No vaginal bleeding.   Small amount of vaginal discharge that is clear and not malodorous.   The history is provided by the patient.  Flank Pain  This is a new problem. The current episode started 2 days ago. The problem occurs constantly. The problem has been gradually worsening. Pertinent negatives include no chest pain, no abdominal pain, no headaches and no shortness of breath. The symptoms are aggravated by walking. Nothing relieves the symptoms.    Past Medical History:  Diagnosis Date  . Chlamydia   . Depression   . PONV (postoperative nausea and vomiting)   . Trichomonas   . Urinary tract infection     Patient Active Problem List   Diagnosis Date Noted  . Depression 12/16/2011    Class: Chronic  . Cannabis use, uncomplicated 12/16/2011    Class: Chronic  . Supervision of other normal pregnancy 10/29/2010  . Blunt trauma of abdominal wall 10/29/2010    Past Surgical History:  Procedure Laterality Date  . CESAREAN SECTION    . TUBAL LIGATION  01/10/2011   Procedure: POST PARTUM TUBAL LIGATION;  Surgeon: Catalina Antigua, MD;  Location: [facility];  Service: Gynecology;  Laterality: Bilateral;  Bilateral post partum tubal ligation.    OB History    Gravida Para Term Preterm AB Living   7 5 5  0 2 5   SAB TAB Ectopic Multiple Live Births   1 1     5        Home Medications    Prior to Admission medications   Medication Sig Start Date End Date Taking? Authorizing Provider  ondansetron (ZOFRAN ODT) 8 MG disintegrating tablet Take  1 tablet (8 mg total) by mouth every 8 (eight) hours as needed for nausea. 06/06/15  Yes Derwood Kaplan, MD  acetaminophen (TYLENOL 8 HOUR) 650 MG CR tablet Take 1 tablet (650 mg total) by mouth every 8 (eight) hours as needed for pain. 06/06/15   Derwood Kaplan, MD  benzonatate (TESSALON) 100 MG capsule Take 1 capsule (100 mg total) by mouth every 8 (eight) hours. 06/06/15   Derwood Kaplan, MD  cephALEXin (KEFLEX) 500 MG capsule Take 1 capsule (500 mg total) by mouth 4 (four) times daily. 12/28/15   Linwood Dibbles, MD  oxyCODONE-acetaminophen (PERCOCET) 5-325 MG per tablet Take 1-2 tablets by mouth every 6 (six) hours as needed (for pain). Patient not taking: Reported on 06/06/2015 08/27/14   Paula Libra, MD  phenazopyridine (PYRIDIUM) 200 MG tablet Take 1 tablet (200 mg total) by mouth 3 (three) times daily. 12/28/15   Linwood Dibbles, MD    Family History Family History  Problem Relation Age of Onset  . Hypertension Maternal Grandmother   . Diabetes Paternal Grandfather     Social History Social History  Substance Use Topics  . Smoking status: Current Some Day Smoker    Packs/day: 0.25    Years: 3.00  . Smokeless tobacco: Never Used  . Alcohol use No     Allergies  Strawberry extract and Aspirin   Review of Systems Review of Systems  Respiratory: Negative for shortness of breath.   Cardiovascular: Negative for chest pain.  Gastrointestinal: Positive for nausea. Negative for abdominal pain.  Genitourinary: Positive for flank pain.  Neurological: Negative for headaches.  All other systems reviewed and are negative.    Physical Exam Updated Vital Signs BP 100/60 (BP Location: Right Arm)   Pulse (!) 56   Temp 97.9 F (36.6 C) (Oral)   Resp 16   Ht 5\' 8"  (1.727 m)   Wt 83.9 kg   LMP 12/15/2015 (Exact Date) Comment: irregular periods  SpO2 100%   BMI 28.13 kg/m   Physical Exam  Constitutional: She appears well-developed and well-nourished. No distress.  HENT:  Head:  Normocephalic and atraumatic.  Right Ear: External ear normal.  Left Ear: External ear normal.  Eyes: Conjunctivae are normal. Right eye exhibits no discharge. Left eye exhibits no discharge. No scleral icterus.  Neck: Neck supple. No tracheal deviation present.  Cardiovascular: Normal rate, regular rhythm and intact distal pulses.   Pulmonary/Chest: Effort normal and breath sounds normal. No stridor. No respiratory distress. She has no wheezes. She has no rales.  Abdominal: Soft. Bowel sounds are normal. She exhibits no distension. There is tenderness in the suprapubic area. There is CVA tenderness (right). There is no rebound and no guarding. No hernia.  Musculoskeletal: She exhibits no edema or tenderness.  Neurological: She is alert. She has normal strength. No cranial nerve deficit (no facial droop, extraocular movements intact, no slurred speech) or sensory deficit. She exhibits normal muscle tone. She displays no seizure activity. Coordination normal.  Skin: Skin is warm and dry. No rash noted.  Psychiatric: She has a normal mood and affect.  Nursing note and vitals reviewed.    ED Treatments / Results  Labs (all labs ordered are listed, but only abnormal results are displayed) Labs Reviewed  COMPREHENSIVE METABOLIC PANEL - Abnormal; Notable for the following:       Result Value   Calcium 8.8 (*)    Alkaline Phosphatase 28 (*)    All other components within normal limits  CBC WITH DIFFERENTIAL/PLATELET - Abnormal; Notable for the following:    WBC 11.0 (*)    Hemoglobin 11.4 (*)    HCT 34.5 (*)    All other components within normal limits  URINALYSIS, ROUTINE W REFLEX MICROSCOPIC (NOT AT [facility]) - Abnormal; Notable for the following:    APPearance CLOUDY (*)    Nitrite POSITIVE (*)    Leukocytes, UA MODERATE (*)    All other components within normal limits  URINE MICROSCOPIC-ADD ON - Abnormal; Notable for the following:    Squamous Epithelial / LPF 6-30 (*)    Bacteria, UA  MANY (*)    All other components within normal limits  URINE CULTURE  LIPASE, BLOOD  PREGNANCY, URINE     Procedures Procedures (including critical care time)  Medications Ordered in ED Medications  sodium chloride 0.9 % bolus 1,000 mL (1,000 mLs Intravenous Bolus from Bag 12/28/15 1001)    And  0.9 %  sodium chloride infusion (1,000 mL/hr Intravenous Rate/Dose Change 12/28/15 1000)  HYDROmorphone (DILAUDID) injection 0.5 mg (0.5 mg Intravenous Given 12/28/15 1003)  cephALEXin (KEFLEX) capsule 500 mg (not administered)  ondansetron (ZOFRAN) injection 4 mg (4 mg Intravenous Given 12/28/15 1002)     Initial Impression / Assessment and Plan / ED Course  I have reviewed the triage vital signs and the nursing notes.  Pertinent labs & imaging results that were available during my care of the patient were reviewed by me and considered in my medical decision making (see chart for details).  Clinical Course  Value Comment By Time  Glucose: 90 (Reviewed) Linwood DibblesJon Patrizia Paule, MD 10/08 1022  Patient's urinalysis is consistent with urinary tract infection. She also has a mild leukocytosis. I suspect her urinary discomfort and flank pain is associated with this urinary tract infection. Plan on discharge with oral antibiotics, Keflex. Pyridium for pain. Follow-up with primary doctor in 1 week. Urine culture was sent off for further analysis. Discussed warning signs and precautions.   Final Clinical Impressions(s) / ED Diagnoses   Final diagnoses:  Urinary tract infection without hematuria, site unspecified    New Prescriptions New Prescriptions   CEPHALEXIN (KEFLEX) 500 MG CAPSULE    Take 1 capsule (500 mg total) by mouth 4 (four) times daily.   PHENAZOPYRIDINE (PYRIDIUM) 200 MG TABLET    Take 1 tablet (200 mg total) by mouth 3 (three) times daily.     Linwood DibblesJon Aariyana Manz, MD 12/28/15 1052

## 2015-12-28 NOTE — ED Triage Notes (Signed)
Right flank pain x 2 days and urgency in urination, nausea

## 2015-12-28 NOTE — Discharge Instructions (Signed)
Follow up with a primary care doctor this week if not getting better, return for fever , vomiting

## 2015-12-31 LAB — URINE CULTURE: Culture: >=100000 — AB

## 2016-01-01 ENCOUNTER — Telehealth (HOSPITAL_BASED_OUTPATIENT_CLINIC_OR_DEPARTMENT_OTHER): Payer: Self-pay | Admitting: Emergency Medicine

## 2016-01-01 NOTE — Telephone Encounter (Signed)
Post ED Visit - Positive Culture Follow-up  Culture report reviewed by antimicrobial stewardship pharmacist:  []  Enzo BiNathan Batchelder, Pharm.D. []  Celedonio MiyamotoJeremy Frens, Pharm.D., BCPS []  Garvin FilaMike Maccia, Pharm.D. []  Georgina PillionElizabeth Martin, Pharm.D., BCPS []  HobuckenMinh Pham, [address]Ph[address]D., BCPS, AAHIVP []  Estella HuskMichelle Turner, Pharm.D., BCPS, AAHIVP []  Tennis Mustassie Stewart, Pharm.D. []  Sherle Poeob Vincent, [address]Ph[address] Mackie Paienee Ackley PharmD  Positive urine culture Treated with cephalexin, organism sensitive to the same and no further patient follow-up is required at this time.  Berle MullMiller, Shomari Scicchitano 01/01/2016, 9:26 AM

## 2016-02-18 ENCOUNTER — Emergency Department (HOSPITAL_COMMUNITY)
Admission: EM | Admit: 2016-02-18 | Discharge: 2016-02-18 | Disposition: A | Discharge status: Home / Self Care | Payer: BLUE CROSS/BLUE SHIELD | Attending: Emergency Medicine | Admitting: Emergency Medicine

## 2016-02-18 ENCOUNTER — Encounter (HOSPITAL_COMMUNITY): Payer: Self-pay | Admitting: *Deleted

## 2016-02-18 DIAGNOSIS — R109 Unspecified abdominal pain: Secondary | ICD-10-CM | POA: Diagnosis not present

## 2016-02-18 DIAGNOSIS — R112 Nausea with vomiting, unspecified: Secondary | ICD-10-CM | POA: Insufficient documentation

## 2016-02-18 DIAGNOSIS — F17200 Nicotine dependence, unspecified, uncomplicated: Secondary | ICD-10-CM | POA: Insufficient documentation

## 2016-02-18 DIAGNOSIS — Z5321 Procedure and treatment not carried out due to patient leaving prior to being seen by health care provider: Secondary | ICD-10-CM | POA: Diagnosis not present

## 2016-02-18 LAB — CBC
HCT: 38.4 % (ref 36.0–46.0)
HEMOGLOBIN: 12.6 g/dL (ref 12.0–15.0)
MCH: 28.9 pg (ref 26.0–34.0)
MCHC: 32.8 g/dL (ref 30.0–36.0)
MCV: 88.1 fL (ref 78.0–100.0)
PLATELETS: 209 10*3/uL (ref 150–400)
RBC: 4.36 MIL/uL (ref 3.87–5.11)
RDW: 13.2 % (ref 11.5–15.5)
WBC: 14.5 10*3/uL — AB (ref 4.0–10.5)

## 2016-02-18 LAB — COMPREHENSIVE METABOLIC PANEL
ALK PHOS: 32 U/L — AB (ref 38–126)
ALT: 21 U/L (ref 14–54)
ANION GAP: 8 (ref 5–15)
AST: 19 U/L (ref 15–41)
Albumin: 4.2 g/dL (ref 3.5–5.0)
BUN: 8 mg/dL (ref 6–20)
CALCIUM: 9.3 mg/dL (ref 8.9–10.3)
CHLORIDE: 107 mmol/L (ref 101–111)
CO2: 24 mmol/L (ref 22–32)
CREATININE: 0.57 mg/dL (ref 0.44–1.00)
Glucose, Bld: 93 mg/dL (ref 65–99)
Potassium: 3.9 mmol/L (ref 3.5–5.1)
Sodium: 139 mmol/L (ref 135–145)
Total Bilirubin: 0.6 mg/dL (ref 0.3–1.2)
Total Protein: 7.5 g/dL (ref 6.5–8.1)

## 2016-02-18 LAB — I-STAT BETA HCG BLOOD, ED (MC, WL, AP ONLY)

## 2016-02-18 LAB — LIPASE, BLOOD: LIPASE: 39 U/L (ref 11–51)

## 2016-02-18 MED ORDER — ONDANSETRON 4 MG PO TBDP
4.0000 mg | ORAL_TABLET | Freq: Once | ORAL | Status: AC | PRN
  Administered 2016-02-18: 4 mg via ORAL
  Filled 2016-02-18: qty 1

## 2016-02-18 MED ORDER — SODIUM CHLORIDE 0.9 % IV BOLUS (SEPSIS): 1000.0000 mL | Freq: Once | INTRAVENOUS | Status: DC

## 2016-02-18 NOTE — ED Notes (Signed)
Pt called for vitals recheck. No answer x2

## 2016-02-18 NOTE — ED Triage Notes (Signed)
Pt reports feeling ok this am but onset 0930 of mid abd pain and n/v. Denies diarrhea.

## 2016-02-18 NOTE — ED Notes (Signed)
Pt. Stated, I think Im going to the [facility] on [address] they don't have as many people as we do. Thank you but I'd rather go there.

## 2016-02-19 ENCOUNTER — Encounter (HOSPITAL_COMMUNITY): Payer: Self-pay | Admitting: Emergency Medicine

## 2016-02-19 ENCOUNTER — Emergency Department (HOSPITAL_COMMUNITY): Payer: BLUE CROSS/BLUE SHIELD

## 2016-02-19 ENCOUNTER — Emergency Department (HOSPITAL_COMMUNITY)
Admission: EM | Admit: 2016-02-19 | Discharge: 2016-02-19 | Disposition: A | Discharge status: Home / Self Care | Payer: BLUE CROSS/BLUE SHIELD | Attending: Emergency Medicine | Admitting: Emergency Medicine

## 2016-02-19 DIAGNOSIS — R112 Nausea with vomiting, unspecified: Secondary | ICD-10-CM | POA: Diagnosis not present

## 2016-02-19 DIAGNOSIS — Z79899 Other long term (current) drug therapy: Secondary | ICD-10-CM | POA: Diagnosis not present

## 2016-02-19 DIAGNOSIS — R1032 Left lower quadrant pain: Secondary | ICD-10-CM | POA: Diagnosis not present

## 2016-02-19 DIAGNOSIS — K029 Dental caries, unspecified: Secondary | ICD-10-CM | POA: Diagnosis not present

## 2016-02-19 DIAGNOSIS — F17200 Nicotine dependence, unspecified, uncomplicated: Secondary | ICD-10-CM | POA: Insufficient documentation

## 2016-02-19 DIAGNOSIS — K0889 Other specified disorders of teeth and supporting structures: Secondary | ICD-10-CM

## 2016-02-19 LAB — WET PREP, GENITAL
Clue Cells Wet Prep HPF POC: NONE SEEN
Sperm: NONE SEEN
Trich, Wet Prep: NONE SEEN
Yeast Wet Prep HPF POC: NONE SEEN

## 2016-02-19 LAB — CBC
HEMATOCRIT: 37.8 % (ref 36.0–46.0)
HEMOGLOBIN: 12.4 g/dL (ref 12.0–15.0)
MCH: 29.1 pg (ref 26.0–34.0)
MCHC: 32.8 g/dL (ref 30.0–36.0)
MCV: 88.7 fL (ref 78.0–100.0)
Platelets: 225 10*3/uL (ref 150–400)
RBC: 4.26 MIL/uL (ref 3.87–5.11)
RDW: 13.3 % (ref 11.5–15.5)
WBC: 7.7 10*3/uL (ref 4.0–10.5)

## 2016-02-19 LAB — COMPREHENSIVE METABOLIC PANEL
ALBUMIN: 4.6 g/dL (ref 3.5–5.0)
ALK PHOS: 32 U/L — AB (ref 38–126)
ALT: 21 U/L (ref 14–54)
ANION GAP: 4 — AB (ref 5–15)
AST: 19 U/L (ref 15–41)
BUN: 12 mg/dL (ref 6–20)
CALCIUM: 8.9 mg/dL (ref 8.9–10.3)
CO2: 29 mmol/L (ref 22–32)
Chloride: 106 mmol/L (ref 101–111)
Creatinine, Ser: 0.62 mg/dL (ref 0.44–1.00)
GFR calc Af Amer: >60 mL/min (ref >60)
GFR calc non Af Amer: >60 mL/min (ref >60)
GLUCOSE: 104 mg/dL — AB (ref 65–99)
POTASSIUM: 3.6 mmol/L (ref 3.5–5.1)
SODIUM: 139 mmol/L (ref 135–145)
Total Bilirubin: 1 mg/dL (ref 0.3–1.2)
Total Protein: 7.8 g/dL (ref 6.5–8.1)

## 2016-02-19 LAB — URINALYSIS, ROUTINE W REFLEX MICROSCOPIC
GLUCOSE, UA: NEGATIVE mg/dL
HGB URINE DIPSTICK: NEGATIVE
Ketones, ur: NEGATIVE mg/dL
Leukocytes, UA: NEGATIVE
Nitrite: NEGATIVE
PH: 6 (ref 5.0–8.0)
Protein, ur: NEGATIVE mg/dL
SPECIFIC GRAVITY, URINE: 1.036 — AB (ref 1.005–1.030)

## 2016-02-19 LAB — LIPASE, BLOOD: Lipase: 48 U/L (ref 11–51)

## 2016-02-19 LAB — I-STAT BETA HCG BLOOD, ED (MC, WL, AP ONLY): I-stat hCG, quantitative: <5 m[IU]/mL (ref <5)

## 2016-02-19 MED ORDER — IOPAMIDOL (ISOVUE-300) INJECTION 61%
100.0000 mL | Freq: Once | INTRAVENOUS | Status: AC | PRN
  Administered 2016-02-19: 100 mL via INTRAVENOUS

## 2016-02-19 MED ORDER — ONDANSETRON 4 MG PO TBDP
4.0000 mg | ORAL_TABLET | Freq: Once | ORAL | Status: AC | PRN
  Administered 2016-02-19: 4 mg via ORAL
  Filled 2016-02-19: qty 1

## 2016-02-19 MED ORDER — ONDANSETRON HCL 4 MG/2ML IJ SOLN
4.0000 mg | Freq: Once | INTRAMUSCULAR | Status: AC
  Administered 2016-02-19: 4 mg via INTRAVENOUS
  Filled 2016-02-19: qty 2

## 2016-02-19 MED ORDER — PENICILLIN V POTASSIUM 500 MG PO TABS: 500.0000 mg | ORAL_TABLET | Freq: Four times a day (QID) | ORAL | 0 refills | Status: AC

## 2016-02-19 MED ORDER — MORPHINE SULFATE (PF) 4 MG/ML IV SOLN
4.0000 mg | Freq: Once | INTRAVENOUS | Status: AC
  Administered 2016-02-19: 4 mg via INTRAVENOUS
  Filled 2016-02-19: qty 1

## 2016-02-19 MED ORDER — SODIUM CHLORIDE 0.9 % IV BOLUS (SEPSIS)
1000.0000 mL | Freq: Once | INTRAVENOUS | Status: AC
  Administered 2016-02-19: 1000 mL via INTRAVENOUS

## 2016-02-19 MED ORDER — ONDANSETRON HCL 4 MG PO TABS: 4.0000 mg | ORAL_TABLET | Freq: Four times a day (QID) | ORAL | 0 refills | Status: DC

## 2016-02-19 NOTE — ED Triage Notes (Signed)
Pt c/o emesis x 2 days, fevers/chills, shakes, syncope. Possible infected tooth. [facility], did receive antiemetic which temporarily relieved symptoms.

## 2016-02-19 NOTE — Discharge Instructions (Signed)
Medications: Penicillin, Zofran  Treatment: Take penicillin 4 times daily for 7 days. Make sure to finish all this medication. Take Zofran every 6 hours as needed for nausea and vomiting.  Follow-up: Please follow-up and establish care with a primary care provider by calling the number circled on your discharge paperwork. [facility] Follow up with gastroenterology as needed. Please see a dentist as soon as possible for further evaluation and treatment of your tooth pain. Please return to the emergency department if you develop any new or worsening symptoms.

## 2016-02-19 NOTE — ED Notes (Signed)
Pt was given ginger ale for fluid challenge. 

## 2016-02-19 NOTE — ED Notes (Signed)
Pt is calm friend is at bedside visiting with patient, pt denies Nausea at this time.

## 2016-02-19 NOTE — ED Notes (Signed)
Pt ambulated to restroom with a steady gait and given cup for urine sample.

## 2016-02-19 NOTE — ED Notes (Signed)
Pt tolerated PO challenge without nausea.  

## 2016-02-19 NOTE — ED Provider Notes (Signed)
[facility]-EMERGENCY DEPT Provider Note   CSN: [id number] Arrival date & time: 02/19/16  1402     History   Chief Complaint Chief Complaint  Patient presents with  . Emesis  . Abdominal Pain    HPI Anna Gregory is a 31 y.o. female who presents with a one-month history of left lower quadrant pain and 2 day history of intractable nausea and vomiting. Patient reports associated chills. Patient reports she has felt swollen in her pelvic area, but denies any abnormal bleeding or discharge. Patient's LMP was 2 weeks ago. She reported it was lighter than normal. Patient had a near syncopal episode at work today. Patient also has associated tooth pain that she has been intending to have removed. She has not been treated with antibiotics. Patient reports soreness in her abdomen and chest from vomiting. She denies any urinary symptoms.  HPI  Past Medical History:  Diagnosis Date  . Chlamydia   . Depression   . PONV (postoperative nausea and vomiting)   . Trichomonas   . Urinary tract infection     Patient Active Problem List   Diagnosis Date Noted  . Depression 12/16/2011    Class: Chronic  . Cannabis use, uncomplicated 12/16/2011    Class: Chronic  . Supervision of other normal pregnancy 10/29/2010  . Blunt trauma of abdominal wall 10/29/2010    Past Surgical History:  Procedure Laterality Date  . CESAREAN SECTION    . TUBAL LIGATION  01/10/2011   Procedure: POST PARTUM TUBAL LIGATION;  Surgeon: Catalina Antigua, MD;  Location: [facility];  Service: Gynecology;  Laterality: Bilateral;  Bilateral post partum tubal ligation.    OB History    Gravida Para Term Preterm AB Living   7 5 5  0 2 5   SAB TAB Ectopic Multiple Live Births   1 1     5        Home Medications    Prior to Admission medications   Medication Sig Start Date End Date Taking? Authorizing Provider  cephALEXin (KEFLEX) 500 MG capsule Take 1 capsule (500 mg total) by mouth 4 (four) times daily. Patient not  taking: Reported on 02/19/2016 12/28/15   Linwood Dibbles, MD  ondansetron (ZOFRAN) 4 MG tablet Take 1 tablet (4 mg total) by mouth every 6 (six) hours. 02/19/16   Emi Holes, PA-C  penicillin v potassium (VEETID) 500 MG tablet Take 1 tablet (500 mg total) by mouth 4 (four) times daily. 02/19/16 02/26/16  Emi Holes, PA-C  phenazopyridine (PYRIDIUM) 200 MG tablet Take 1 tablet (200 mg total) by mouth 3 (three) times daily. Patient not taking: Reported on 02/19/2016 12/28/15   Linwood Dibbles, MD    Family History Family History  Problem Relation Age of Onset  . Hypertension Maternal Grandmother   . Diabetes Paternal Grandfather     Social History Social History  Substance Use Topics  . Smoking status: Current Some Day Smoker    Packs/day: 0.25    Years: 3.00  . Smokeless tobacco: Never Used  . Alcohol use No     Allergies   Strawberry extract; Dilaudid [hydromorphone hcl]; and Aspirin   Review of Systems Review of Systems  Constitutional: Negative for chills and fever.  HENT: Negative for facial swelling and sore throat.   Respiratory: Negative for shortness of breath.   Cardiovascular: Negative for chest pain.  Gastrointestinal: Positive for abdominal pain, nausea and vomiting.  Genitourinary: Positive for decreased urine volume. Negative for dysuria.  Musculoskeletal: Negative for  back pain.  Skin: Negative for rash and wound.  Neurological: Negative for headaches.  Psychiatric/Behavioral: The patient is not nervous/anxious.      Physical Exam Updated Vital Signs BP 104/76 (BP Location: Left Arm)   Pulse 66   Temp 98.2 F (36.8 C) (Oral)   Resp 18   Ht 5\' 8"  (1.727 m)   Wt 81.6 kg   LMP 01/21/2016   SpO2 100%   BMI 27.37 kg/m   Physical Exam  Constitutional: She appears well-developed and well-nourished. No distress.  HENT:  Head: Normocephalic and atraumatic.  Mouth/Throat: Oropharynx is clear and moist. No trismus in the jaw. Dental caries present. No  dental abscesses. No oropharyngeal exudate.    Eyes: Conjunctivae are normal. Pupils are equal, round, and reactive to light. Right eye exhibits no discharge. Left eye exhibits no discharge. No scleral icterus.  Neck: Normal range of motion. Neck supple. No thyromegaly present.  Cardiovascular: Normal rate, regular rhythm, normal heart sounds and intact distal pulses.  Exam reveals no gallop and no friction rub.   No murmur heard. Pulmonary/Chest: Effort normal and breath sounds normal. No stridor. No respiratory distress. She has no wheezes. She has no rales.  Abdominal: Soft. Bowel sounds are normal. She exhibits no distension. There is tenderness in the periumbilical area and left lower quadrant. There is no rebound, no guarding, no CVA tenderness and no tenderness at McBurney's point.    Genitourinary: Cervix exhibits motion tenderness and discharge. Right adnexum displays tenderness. Right adnexum displays no mass and no fullness. Left adnexum displays tenderness. Left adnexum displays no mass and no fullness. No tenderness in the vagina. Vaginal discharge (scant, most likely physiologic) found.  Musculoskeletal: She exhibits no edema.       Thoracic back: She exhibits tenderness. She exhibits no bony tenderness.       Back:  Lymphadenopathy:    She has no cervical adenopathy.  Neurological: She is alert. Coordination normal.  Skin: Skin is warm and dry. No rash noted. She is not diaphoretic. No pallor.  Psychiatric: She has a normal mood and affect.  Nursing note and vitals reviewed.    ED Treatments / Results  Labs (all labs ordered are listed, but only abnormal results are displayed) Labs Reviewed  WET PREP, GENITAL - Abnormal; Notable for the following:       Result Value   WBC, Wet Prep HPF POC RARE (*)    All other components within normal limits  COMPREHENSIVE METABOLIC PANEL - Abnormal; Notable for the following:    Glucose, Bld 104 (*)    Alkaline Phosphatase 32 (*)      Anion gap 4 (*)    All other components within normal limits  URINALYSIS, ROUTINE W REFLEX MICROSCOPIC (NOT AT [facility]) - Abnormal; Notable for the following:    Color, Urine AMBER (*)    APPearance CLOUDY (*)    Specific Gravity, Urine 1.036 (*)    Bilirubin Urine SMALL (*)    All other components within normal limits  LIPASE, BLOOD  CBC  RPR  HIV ANTIBODY (ROUTINE TESTING)  I-STAT BETA HCG BLOOD, ED ([facility], [facility], [facility] ONLY)  GC/CHLAMYDIA PROBE AMP ([place]) NOT AT [facility]    EKG  EKG Interpretation None       Radiology [place] Transvaginal Non-ob  Result Date: 02/19/2016 CLINICAL DATA:  Pelvic pain EXAM: TRANSABDOMINAL AND TRANSVAGINAL ULTRASOUND OF PELVIS DOPPLER ULTRASOUND OF OVARIES TECHNIQUE: Both transabdominal and transvaginal ultrasound examinations of the pelvis were performed. Transabdominal  technique was performed for global imaging of the pelvis including uterus, ovaries, adnexal regions, and pelvic cul-de-sac. It was necessary to proceed with endovaginal exam following the transabdominal exam to visualize the endometrium and ovaries. Color and duplex Doppler ultrasound was utilized to evaluate blood flow to the ovaries. COMPARISON:  CT 02/19/2016 FINDINGS: Uterus Measurements: 7.5 x 5 x 5.4 cm transvaginally. No fibroids or other mass visualized. Endometrium Thickness: 8 mm.  No focal abnormality visualized. Right ovary Measurements: 4 x 2.4 x 2.4 cm.  Simple 1.8 cm cyst or follicle. Left ovary Measurements: 3.7 x 1.6 x 2.2 cm. Normal appearance/no adnexal mass. Pulsed Doppler evaluation of both ovaries demonstrates normal low-resistance arterial and venous waveforms. Other findings No abnormal free fluid.  Prominent pelvic vessels. IMPRESSION: No sonographic evidence for ovarian torsion. No focal abnormality or suspicious adnexal masses are identified Electronically Signed   By: Jasmine PangKim  Fujinaga M.D.   On: 02/19/2016 20:34   [place] Pelvis Complete  Result Date: 02/19/2016 CLINICAL DATA:   Pelvic pain EXAM: TRANSABDOMINAL AND TRANSVAGINAL ULTRASOUND OF PELVIS DOPPLER ULTRASOUND OF OVARIES TECHNIQUE: Both transabdominal and transvaginal ultrasound examinations of the pelvis were performed. Transabdominal technique was performed for global imaging of the pelvis including uterus, ovaries, adnexal regions, and pelvic cul-de-sac. It was necessary to proceed with endovaginal exam following the transabdominal exam to visualize the endometrium and ovaries. Color and duplex Doppler ultrasound was utilized to evaluate blood flow to the ovaries. COMPARISON:  CT 02/19/2016 FINDINGS: Uterus Measurements: 7.5 x 5 x 5.4 cm transvaginally. No fibroids or other mass visualized. Endometrium Thickness: 8 mm.  No focal abnormality visualized. Right ovary Measurements: 4 x 2.4 x 2.4 cm.  Simple 1.8 cm cyst or follicle. Left ovary Measurements: 3.7 x 1.6 x 2.2 cm. Normal appearance/no adnexal mass. Pulsed Doppler evaluation of both ovaries demonstrates normal low-resistance arterial and venous waveforms. Other findings No abnormal free fluid.  Prominent pelvic vessels. IMPRESSION: No sonographic evidence for ovarian torsion. No focal abnormality or suspicious adnexal masses are identified Electronically Signed   By: Jasmine PangKim  Fujinaga M.D.   On: 02/19/2016 20:34   Ct Abdomen Pelvis W Contrast  Result Date: 02/19/2016 CLINICAL DATA:  30 year old female with left abdominal pelvic pain for 1 month and nausea, vomiting and fever for 2 days. EXAM: CT ABDOMEN AND PELVIS WITH CONTRAST TECHNIQUE: Multidetector CT imaging of the abdomen and pelvis was performed using the standard protocol following bolus administration of intravenous contrast. CONTRAST:  100mL ISOVUE-300 IOPAMIDOL (ISOVUE-300) INJECTION 61% COMPARISON:  08/27/2014 CT FINDINGS: Lower chest: No acute abnormality. Hepatobiliary: No focal liver abnormality is seen. No gallstones, gallbladder wall thickening, or biliary dilatation. Pancreas: Unremarkable. No pancreatic  ductal dilatation or surrounding inflammatory changes. Spleen: Normal in size without focal abnormality. Adrenals/Urinary Tract: Adrenal glands are unremarkable. Kidneys are normal, without renal calculi, focal lesion, or hydronephrosis. Bladder is unremarkable. Stomach/Bowel: Stomach is within normal limits. Appendix appears normal. No evidence of bowel wall thickening, distention, or inflammatory changes. Vascular/Lymphatic: No significant vascular findings are present. No enlarged abdominal or pelvic lymph nodes. Reproductive: Uterus and bilateral adnexa are unremarkable. Other: No abdominal wall hernia or abnormality. No abdominopelvic ascites. Musculoskeletal: No acute or significant osseous findings. IMPRESSION: Unremarkable exam.  No acute abnormality. Electronically Signed   By: Harmon PierJeffrey  Hu M.D.   On: 02/19/2016 18:26   [place] Art/ven Flow Abd Pelv Doppler  Result Date: 02/19/2016 CLINICAL DATA:  Pelvic pain EXAM: TRANSABDOMINAL AND TRANSVAGINAL ULTRASOUND OF PELVIS DOPPLER ULTRASOUND OF OVARIES TECHNIQUE: Both transabdominal and transvaginal  ultrasound examinations of the pelvis were performed. Transabdominal technique was performed for global imaging of the pelvis including uterus, ovaries, adnexal regions, and pelvic cul-de-sac. It was necessary to proceed with endovaginal exam following the transabdominal exam to visualize the endometrium and ovaries. Color and duplex Doppler ultrasound was utilized to evaluate blood flow to the ovaries. COMPARISON:  CT 02/19/2016 FINDINGS: Uterus Measurements: 7.5 x 5 x 5.4 cm transvaginally. No fibroids or other mass visualized. Endometrium Thickness: 8 mm.  No focal abnormality visualized. Right ovary Measurements: 4 x 2.4 x 2.4 cm.  Simple 1.8 cm cyst or follicle. Left ovary Measurements: 3.7 x 1.6 x 2.2 cm. Normal appearance/no adnexal mass. Pulsed Doppler evaluation of both ovaries demonstrates normal low-resistance arterial and venous waveforms. Other findings No  abnormal free fluid.  Prominent pelvic vessels. IMPRESSION: No sonographic evidence for ovarian torsion. No focal abnormality or suspicious adnexal masses are identified Electronically Signed   By: Jasmine Pang M.D.   On: 02/19/2016 20:34    Procedures Procedures (including critical care time)  Medications Ordered in ED Medications  ondansetron (ZOFRAN-ODT) disintegrating tablet 4 mg (4 mg Oral Given 02/19/16 1425)  sodium chloride 0.9 % bolus 1,000 mL (0 mLs Intravenous Stopped 02/19/16 1706)  ondansetron (ZOFRAN) injection 4 mg (4 mg Intravenous Given 02/19/16 1749)  iopamidol (ISOVUE-300) 61 % injection 100 mL (100 mLs Intravenous Contrast Given 02/19/16 1808)  morphine 4 MG/ML injection 4 mg (4 mg Intravenous Given 02/19/16 1852)  ondansetron (ZOFRAN-ODT) disintegrating tablet 4 mg (4 mg Oral Given 02/19/16 2109)     Initial Impression / Assessment and Plan / ED Course  I have reviewed the triage vital signs and the nursing notes.  Pertinent labs & imaging results that were available during my care of the patient were reviewed by me and considered in my medical decision making (see chart for details).  Clinical Course    CBC, CMP, lipase unremarkable. Pregnancy negative. UA shows small bilirubin. Wet prep shows rare WBC. GC/chlamydia, HIV, RPR sent and pending. Patient advised she'll be called in 2-3 days if any of her results return positive. Pelvic ultrasound and CT abdomen and pelvis negative. Patient follow-up with OB/GYN, establish care with PCP, and follow up with gastroenterology as needed. Also follow up with dentist as soon as possible; no concern for abscess or Ludwig's angina. Discharged home with penicillin for possible infected tooth and Zofran for symptomatic treatment. Return precautions discussed. Patient understands and agrees with plan. Patient vitals stable at discharge and discharged in satisfactory condition. I discussed patient case with Dr. Jacqulyn Bath who guided the  patient's management and agrees with plan.   Final Clinical Impressions(s) / ED Diagnoses   Final diagnoses:  Left lower quadrant pain  Non-intractable vomiting with nausea, unspecified vomiting type  Pain, dental    New Prescriptions Discharge Medication List as of 02/19/2016  8:57 PM    START taking these medications   Details  ondansetron (ZOFRAN) 4 MG tablet Take 1 tablet (4 mg total) by mouth every 6 (six) hours., Starting Thu 02/19/2016, Print    penicillin v potassium (VEETID) 500 MG tablet Take 1 tablet (500 mg total) by mouth 4 (four) times daily., Starting Thu 02/19/2016, Until Thu 02/26/2016, Print         Emi Holes, PA-C 02/19/16 2135    Maia Plan, MD 02/20/16 [phone]

## 2016-02-20 LAB — RPR: RPR Ser Ql: NONREACTIVE

## 2016-02-20 LAB — GC/CHLAMYDIA PROBE AMP (~~LOC~~) NOT AT ARMC
Chlamydia: NEGATIVE
Neisseria Gonorrhea: NEGATIVE

## 2016-02-20 LAB — HIV ANTIBODY (ROUTINE TESTING W REFLEX): HIV Screen 4th Generation wRfx: NONREACTIVE

## 2017-10-31 ENCOUNTER — Other Ambulatory Visit: Payer: Self-pay

## 2017-10-31 ENCOUNTER — Emergency Department (HOSPITAL_COMMUNITY): Payer: Managed Care, Other (non HMO)

## 2017-10-31 ENCOUNTER — Encounter (HOSPITAL_COMMUNITY): Payer: Self-pay | Admitting: Emergency Medicine

## 2017-10-31 ENCOUNTER — Emergency Department (HOSPITAL_COMMUNITY)
Admission: EM | Admit: 2017-10-31 | Discharge: 2017-10-31 | Disposition: A | Discharge status: Home / Self Care | Payer: Managed Care, Other (non HMO) | Attending: Emergency Medicine | Admitting: Emergency Medicine

## 2017-10-31 DIAGNOSIS — S93602A Unspecified sprain of left foot, initial encounter: Secondary | ICD-10-CM | POA: Diagnosis not present

## 2017-10-31 DIAGNOSIS — Y92480 Sidewalk as the place of occurrence of the external cause: Secondary | ICD-10-CM | POA: Diagnosis not present

## 2017-10-31 DIAGNOSIS — Y33XXXA Other specified events, undetermined intent, initial encounter: Secondary | ICD-10-CM | POA: Insufficient documentation

## 2017-10-31 DIAGNOSIS — Y939 Activity, unspecified: Secondary | ICD-10-CM | POA: Insufficient documentation

## 2017-10-31 DIAGNOSIS — Y998 Other external cause status: Secondary | ICD-10-CM | POA: Insufficient documentation

## 2017-10-31 DIAGNOSIS — S99922A Unspecified injury of left foot, initial encounter: Secondary | ICD-10-CM | POA: Diagnosis present

## 2017-10-31 DIAGNOSIS — Z79899 Other long term (current) drug therapy: Secondary | ICD-10-CM | POA: Insufficient documentation

## 2017-10-31 DIAGNOSIS — F17200 Nicotine dependence, unspecified, uncomplicated: Secondary | ICD-10-CM | POA: Diagnosis not present

## 2017-10-31 DIAGNOSIS — S90812A Abrasion, left foot, initial encounter: Secondary | ICD-10-CM | POA: Insufficient documentation

## 2017-10-31 MED ORDER — IBUPROFEN 600 MG PO TABS: 600.0000 mg | ORAL_TABLET | Freq: Four times a day (QID) | ORAL | 0 refills | Status: DC | PRN

## 2017-10-31 MED ORDER — SULFAMETHOXAZOLE-TRIMETHOPRIM 800-160 MG PO TABS
1.0000 | ORAL_TABLET | Freq: Once | ORAL | Status: AC
Start: 2017-10-31 — End: 2017-10-31
  Administered 2017-10-31: 1 via ORAL
  Filled 2017-10-31: qty 1

## 2017-10-31 MED ORDER — HYDROCODONE-ACETAMINOPHEN 5-325 MG PO TABS
1.0000 | ORAL_TABLET | Freq: Once | ORAL | Status: AC
  Administered 2017-10-31: 1 via ORAL
  Filled 2017-10-31: qty 1

## 2017-10-31 MED ORDER — SULFAMETHOXAZOLE-TRIMETHOPRIM 800-160 MG PO TABS: 1.0000 | ORAL_TABLET | Freq: Two times a day (BID) | ORAL | 0 refills | Status: AC

## 2017-10-31 MED ORDER — HYDROCODONE-ACETAMINOPHEN 5-325 MG PO TABS: ORAL_TABLET | ORAL | 0 refills | Status: DC

## 2017-10-31 NOTE — ED Triage Notes (Signed)
Patient states she stepped off a curve twisting her left foot yesterday. Complaining of pain to left foot.

## 2017-10-31 NOTE — Discharge Instructions (Addendum)
Elevate and apply ice packs on/off to help reduce swelling.  Use the crutches for weight bearing.  Clean the abrasion with mild soap and water.  Wear the wrap as needed for support.  Return here for any worsening symptoms such as increased pain or swelling, fever or red streaks to your foot.

## 2017-11-02 NOTE — ED Provider Notes (Signed)
[facility] EMERGENCY DEPARTMENT Provider Note   CSN: [id number] Arrival date & time: 10/31/17  1803     History   Chief Complaint Chief Complaint  Patient presents with  . Foot Pain    HPI Anna Gregory is a 32 y.o. female.  HPI   Anna Gregory is a 32 y.o. female who presents to the Emergency Department complaining of left foot pain and swelling for one day.  She describes a twisting injury upon stepping off a curb, rolling her ankle.  She has an abrasion to the lateral foot that she has cleaned with tap water.  She describes a throbbing pain with increasing swelling and mild redness across the top of her foot.  Unable to bear weight without significant pain.  She denies pain proximal to the foot, numbness, fever or chills.  Td is up to date   Past Medical History:  Diagnosis Date  . Chlamydia   . Depression   . PONV (postoperative nausea and vomiting)   . Trichomonas   . Urinary tract infection     Patient Active Problem List   Diagnosis Date Noted  . Depression 12/16/2011    Class: Chronic  . Cannabis use, uncomplicated 12/16/2011    Class: Chronic  . Supervision of other normal pregnancy 10/29/2010  . Blunt trauma of abdominal wall 10/29/2010    Past Surgical History:  Procedure Laterality Date  . CESAREAN SECTION    . TUBAL LIGATION  01/10/2011   Procedure: POST PARTUM TUBAL LIGATION;  Surgeon: Catalina Antigua, MD;  Location: [facility];  Service: Gynecology;  Laterality: Bilateral;  Bilateral post partum tubal ligation.     OB History    Gravida  7   Para  5   Term  5   Preterm  0   AB  2   Living  5     SAB  1   TAB  1   Ectopic      Multiple      Live Births  5            Home Medications    Prior to Admission medications   Medication Sig Start Date End Date Taking? Authorizing Provider  cephALEXin (KEFLEX) 500 MG capsule Take 1 capsule (500 mg total) by mouth 4 (four) times daily. Patient not taking: Reported on 02/19/2016  12/28/15   Linwood Dibbles, MD  HYDROcodone-acetaminophen (NORCO/VICODIN) 5-325 MG tablet Take one tab po q 4 hrs prn pain 10/31/17   Areli Frary, PA-C  ibuprofen (ADVIL,MOTRIN) 600 MG tablet Take 1 tablet (600 mg total) by mouth every 6 (six) hours as needed. Take with food 10/31/17   Dorianna Mckiver, PA-C  ondansetron (ZOFRAN) 4 MG tablet Take 1 tablet (4 mg total) by mouth every 6 (six) hours. 02/19/16   Law, Waylan Boga, PA-C  phenazopyridine (PYRIDIUM) 200 MG tablet Take 1 tablet (200 mg total) by mouth 3 (three) times daily. Patient not taking: Reported on 02/19/2016 12/28/15   Linwood Dibbles, MD  sulfamethoxazole-trimethoprim (BACTRIM DS,SEPTRA DS) 800-160 MG tablet Take 1 tablet by mouth 2 (two) times daily for 7 days. 10/31/17 11/07/17  Pauline Aus, PA-C    Family History Family History  Problem Relation Age of Onset  . Hypertension Maternal Grandmother   . Diabetes Paternal Grandfather     Social History Social History   Tobacco Use  . Smoking status: Current Some Day Smoker    Packs/day: 0.25    Years: 3.00    Pack  years: 0.75  . Smokeless tobacco: Never Used  Substance Use Topics  . Alcohol use: No  . Drug use: Yes    Types: Marijuana     Allergies   Strawberry extract; Dilaudid [hydromorphone hcl]; and Aspirin   Review of Systems Review of Systems  Constitutional: Negative for chills and fever.  Musculoskeletal: Positive for arthralgias (left foot pain and swelling ) and joint swelling.  Skin: Positive for color change (mild redness of the left foot) and wound.  Neurological: Negative for weakness and numbness.  All other systems reviewed and are negative.    Physical Exam Updated Vital Signs BP (!) 91/50 (BP Location: Left Arm)   Pulse 88   Temp 98.3 F (36.8 C) (Oral)   Resp 16   Ht 5\' 9"  (1.753 m)   Wt 90.7 kg   LMP 10/24/2017   SpO2 100%   BMI 29.53 kg/m   Physical Exam  Constitutional: She is oriented to person, place, and time. She appears  well-developed and well-nourished. No distress.  HENT:  Head: Atraumatic.  Neck: Normal range of motion.  Cardiovascular: Normal rate, regular rhythm and intact distal pulses.  Pulmonary/Chest: Effort normal and breath sounds normal.  Musculoskeletal: She exhibits edema and tenderness.  Diffuse ttp of the dorsal and lateral left foot.  Mild to moderate edema.  Quarter sized abrasion to the lateral foot.  Mild surrounding erythema.  No proximal tenderness.  Neurological: She is alert and oriented to person, place, and time. No sensory deficit.  Skin: Skin is warm and dry. Capillary refill takes less than 2 seconds. There is erythema.  Nursing note and vitals reviewed.    ED Treatments / Results  Labs (all labs ordered are listed, but only abnormal results are displayed) Labs Reviewed - No data to display  EKG None  Radiology Dg Foot Complete Left  Result Date: 10/31/2017 CLINICAL DATA:  Fall with foot injury EXAM: LEFT FOOT - COMPLETE 3+ VIEW COMPARISON:  None. FINDINGS: There is no evidence of fracture or dislocation. There is no evidence of arthropathy or other focal bone abnormality. Angulation of the distal phalanx of the little toe is likely chronic. Soft tissues are unremarkable. IMPRESSION: No fracture or dislocation of the left foot. Electronically Signed   By: Deatra RobinsonKevin  Herman M.D.   On: 10/31/2017 18:55    Procedures Procedures (including critical care time)  Medications Ordered in ED Medications  sulfamethoxazole-trimethoprim (BACTRIM DS,SEPTRA DS) 800-160 MG per tablet 1 tablet (1 tablet Oral Given 10/31/17 1913)  HYDROcodone-acetaminophen (NORCO/VICODIN) 5-325 MG per tablet 1 tablet (1 tablet Oral Given 10/31/17 1913)     Initial Impression / Assessment and Plan / ED Course  I have reviewed the triage vital signs and the nursing notes.  Pertinent labs & imaging results that were available during my care of the patient were reviewed by me and considered in my medical  decision making (see chart for details).     Likely sprain to the foot.  Concerning exam for possible developing cellulitis secondary to infected abrasion.  NV intact  Wound cleaned, bandaged and bulky dressing applied.  Crutches given.  Pt agrees to wound care instructions and close f/u.  Pt agrees to ER return in 2-3 days if sx's worsening.    Final Clinical Impressions(s) / ED Diagnoses   Final diagnoses:  Sprain of left foot, initial encounter  Abrasion, foot, left, initial encounter    ED Discharge Orders         Ordered  HYDROcodone-acetaminophen (NORCO/VICODIN) 5-325 MG tablet     10/31/17 1920    sulfamethoxazole-trimethoprim (BACTRIM DS,SEPTRA DS) 800-160 MG tablet  2 times daily     10/31/17 1920    ibuprofen (ADVIL,MOTRIN) 600 MG tablet  Every 6 hours PRN     10/31/17 1920           Pauline Ausriplett, Gearldine Looney, PA-C 11/02/17 1334    Bethann BerkshireZammit, Joseph, MD 11/03/17 1240

## 2017-11-28 ENCOUNTER — Other Ambulatory Visit (HOSPITAL_COMMUNITY)
Admission: RE | Admit: 2017-11-28 | Discharge: 2017-11-28 | Disposition: A | Discharge status: Home / Self Care | Payer: Managed Care, Other (non HMO) | Source: Ambulatory Visit | Attending: Adult Health | Admitting: Adult Health

## 2017-11-28 ENCOUNTER — Ambulatory Visit (INDEPENDENT_AMBULATORY_CARE_PROVIDER_SITE_OTHER): Payer: Managed Care, Other (non HMO) | Admitting: Adult Health

## 2017-11-28 ENCOUNTER — Encounter: Payer: Self-pay | Admitting: Adult Health

## 2017-11-28 VITALS — BP 110/73 | HR 95 | Ht 67.0 in | Wt 189.0 lb

## 2017-11-28 DIAGNOSIS — Z1151 Encounter for screening for human papillomavirus (HPV): Secondary | ICD-10-CM | POA: Diagnosis not present

## 2017-11-28 DIAGNOSIS — Z01419 Encounter for gynecological examination (general) (routine) without abnormal findings: Secondary | ICD-10-CM | POA: Insufficient documentation

## 2017-11-28 NOTE — Progress Notes (Addendum)
Patient ID: Anna Gregory, female   DOB: [birth year], 32 y.o.   MRN: [id number] History of Present Illness:  [name] is a 32 year old black female, single, G7P5 in for a well woman gyn exam and pap.She is a new pt, and does not have a PCP at present.   Current Medications, Allergies, Past Medical History, Past Surgical History, Family History and Social History were reviewed in [employer] record.     Review of Systems: Patient denies any headaches, hearing loss, fatigue, blurred vision, shortness of breath, chest pain, abdominal pain, problems with bowel movements, urination, or intercourse. No joint pain or mood swings. Has noticed odor in urine, but can't pee, will send cup home with pt, if she wants to bring urine back.    Physical Exam:BP 110/73 (BP Location: Left Arm, Patient Position: Sitting, Cuff Size: Normal)   Pulse 95   Ht 5\' 7"  (1.702 m)   Wt 189 lb (85.7 kg)   LMP 11/25/2017   BMI 29.60 kg/m  General:  Well developed, well nourished, no acute distress Skin:  Warm and dry,+tattoos  Neck:  Midline trachea, normal thyroid, good ROM, no lymphadenopathy Lungs; Clear to auscultation bilaterally Breast:  No dominant palpable mass, retraction, or nipple discharge Cardiovascular: Regular rate and rhythm Abdomen:  Soft, non tender, no hepatosplenomegaly Pelvic:  External genitalia is normal in appearance, no lesions.  The vagina is normal in appearance. Urethra has no lesions or masses. The cervix is bulbous. Pap with HPV and GC/CHL performed. Uterus is felt to be normal size, shape, and contour.  No adnexal masses or tenderness noted.Bladder is non tender, no masses felt. Extremities/musculoskeletal:  No swelling or varicosities noted, no clubbing or cyanosis Psych:  No mood changes, alert and cooperative,seems happy PHQ 9 score 0. Examination chaperoned by Federico Flake, CMA. She declines labs.   Impression: 1. Encounter for gynecological examination with  Papanicolaou smear of cervix       Plan: Pap with HPV and GC/CHL sent Physical in 1 year Pap in 3 if normal Mammogram at 40

## 2017-11-28 NOTE — Addendum Note (Signed)
Addended by: Federico Flake A on: 11/28/2017 10:44 AM   Modules accepted: Orders

## 2017-11-29 ENCOUNTER — Emergency Department (HOSPITAL_COMMUNITY): Payer: Managed Care, Other (non HMO)

## 2017-11-29 ENCOUNTER — Emergency Department (HOSPITAL_COMMUNITY)
Admission: EM | Admit: 2017-11-29 | Discharge: 2017-11-29 | Disposition: A | Discharge status: Home / Self Care | Payer: Managed Care, Other (non HMO) | Attending: Emergency Medicine | Admitting: Emergency Medicine

## 2017-11-29 ENCOUNTER — Encounter (HOSPITAL_COMMUNITY): Payer: Self-pay

## 2017-11-29 ENCOUNTER — Other Ambulatory Visit: Payer: Self-pay

## 2017-11-29 DIAGNOSIS — F17210 Nicotine dependence, cigarettes, uncomplicated: Secondary | ICD-10-CM | POA: Diagnosis not present

## 2017-11-29 DIAGNOSIS — Y92009 Unspecified place in unspecified non-institutional (private) residence as the place of occurrence of the external cause: Secondary | ICD-10-CM | POA: Diagnosis not present

## 2017-11-29 DIAGNOSIS — S0083XA Contusion of other part of head, initial encounter: Secondary | ICD-10-CM | POA: Diagnosis not present

## 2017-11-29 DIAGNOSIS — Y999 Unspecified external cause status: Secondary | ICD-10-CM | POA: Insufficient documentation

## 2017-11-29 DIAGNOSIS — Y9389 Activity, other specified: Secondary | ICD-10-CM | POA: Insufficient documentation

## 2017-11-29 DIAGNOSIS — S20211A Contusion of right front wall of thorax, initial encounter: Secondary | ICD-10-CM

## 2017-11-29 DIAGNOSIS — S0990XA Unspecified injury of head, initial encounter: Secondary | ICD-10-CM | POA: Diagnosis present

## 2017-11-29 MED ORDER — ACETAMINOPHEN 500 MG PO TABS
1000.0000 mg | ORAL_TABLET | Freq: Once | ORAL | Status: AC
  Administered 2017-11-29: 1000 mg via ORAL
  Filled 2017-11-29: qty 2

## 2017-11-29 NOTE — Discharge Instructions (Addendum)
Use ice packs for comfort, swelling and pain. You can take ibuprofen 600 mg + acetaminophen 1000 mg every 6 hrs for pain as needed. Return to the ED if you get short of breath or have difficulty breathing or you have any problems listed on the head injury sheet.
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
## 2017-11-30 LAB — CYTOLOGY - PAP
CHLAMYDIA, DNA PROBE: NEGATIVE
Diagnosis: NEGATIVE
HPV: NOT DETECTED
NEISSERIA GONORRHEA: NEGATIVE

## 2018-08-15 ENCOUNTER — Other Ambulatory Visit: Payer: Self-pay

## 2018-08-15 ENCOUNTER — Emergency Department (HOSPITAL_COMMUNITY)
Admission: EM | Admit: 2018-08-15 | Discharge: 2018-08-15 | Disposition: A | Discharge status: Home / Self Care | Payer: Managed Care, Other (non HMO) | Attending: Emergency Medicine | Admitting: Emergency Medicine

## 2018-08-15 ENCOUNTER — Encounter (HOSPITAL_COMMUNITY): Payer: Self-pay | Admitting: *Deleted

## 2018-08-15 DIAGNOSIS — F17210 Nicotine dependence, cigarettes, uncomplicated: Secondary | ICD-10-CM | POA: Insufficient documentation

## 2018-08-15 DIAGNOSIS — G43009 Migraine without aura, not intractable, without status migrainosus: Secondary | ICD-10-CM | POA: Insufficient documentation

## 2018-08-15 DIAGNOSIS — R51 Headache: Secondary | ICD-10-CM | POA: Diagnosis present

## 2018-08-15 MED ORDER — PROCHLORPERAZINE EDISYLATE 10 MG/2ML IJ SOLN
10.0000 mg | Freq: Once | INTRAMUSCULAR | Status: AC
  Administered 2018-08-15: 10 mg via INTRAVENOUS
  Filled 2018-08-15: qty 2

## 2018-08-15 MED ORDER — PROCHLORPERAZINE MALEATE 10 MG PO TABS: 10.0000 mg | ORAL_TABLET | Freq: Two times a day (BID) | ORAL | 0 refills | Status: AC | PRN

## 2018-08-15 MED ORDER — SODIUM CHLORIDE 0.9 % IV BOLUS
1000.0000 mL | Freq: Once | INTRAVENOUS | Status: AC
  Administered 2018-08-15: 1000 mL via INTRAVENOUS

## 2018-08-15 MED ORDER — KETOROLAC TROMETHAMINE 15 MG/ML IJ SOLN
15.0000 mg | Freq: Once | INTRAMUSCULAR | Status: AC
  Administered 2018-08-15: 15 mg via INTRAVENOUS
  Filled 2018-08-15: qty 1

## 2018-08-15 MED ORDER — DIPHENHYDRAMINE HCL 50 MG/ML IJ SOLN
25.0000 mg | Freq: Once | INTRAMUSCULAR | Status: AC
  Administered 2018-08-15: 25 mg via INTRAVENOUS
  Filled 2018-08-15: qty 1

## 2018-08-15 NOTE — Discharge Instructions (Addendum)
You were evaluated in the Emergency Department and after careful evaluation, we did not find any emergent condition requiring admission or further testing in the hospital.  Your symptoms today seem to be due to a migraine headache.  Please follow-up with your primary care doctor to discuss medications that can prevent migraines.  Please return to the Emergency Department if you experience any worsening of your condition.  We encourage you to follow up with a primary care provider.  Thank you for allowing [place] to be a part of your care.

## 2018-08-15 NOTE — ED Provider Notes (Signed)
[facility] Emergency Department Provider Note MRN:  [id number]  Arrival date & time: 08/15/18     Chief Complaint   Headache   History of Present Illness   Anna Gregory is a 33 y.o. year-old female with a history of migraines presenting to the ED with chief complaint of headache.  Gradual onset headache that began yesterday evening.  Still present this morning.  There is similar to prior migraine headaches.  Left-sided, dull throbbing pain, worse with bright lights, worse with loud noises.  Denies nausea or vomiting, no numbness or weakness to the arms or legs, no neck pain or stiffness.  Patient explains that she tends to cry hysterically when she has these migraines.  Took her temperature at home and it was measured at 100.5.  She has not been having any cough, has not been feeling ill or febrile, no burning with urination, no abdominal pain, no chest pain or shortness of breath.  She feels like this elevated temperature was because she was upset.  Review of Systems  A complete 10 system review of systems was obtained and all systems are negative except as noted in the HPI and PMH.   Patient's Health History    Past Medical History:  Diagnosis Date  . Chlamydia   . Depression   . PONV (postoperative nausea and vomiting)   . Trichomonas   . Urinary tract infection     Past Surgical History:  Procedure Laterality Date  . CESAREAN SECTION    . TUBAL LIGATION  01/10/2011   Procedure: POST PARTUM TUBAL LIGATION;  Surgeon: Catalina Antigua, MD;  Location: [facility];  Service: Gynecology;  Laterality: Bilateral;  Bilateral post partum tubal ligation.    Family History  Problem Relation Age of Onset  . Lung cancer Mother   . Hypertension Maternal Grandmother   . Diabetes Paternal Grandfather   . Aneurysm Paternal Grandmother     Social History   Socioeconomic History  . Marital status: Single    Spouse name: Not on file  . Number of children: Not on file  .  Years of education: Not on file  . Highest education level: Not on file  Occupational History  . Not on file  Social Needs  . Financial resource strain: Not on file  . Food insecurity:    Worry: Not on file    Inability: Not on file  . Transportation needs:    Medical: Not on file    Non-medical: Not on file  Tobacco Use  . Smoking status: Current Some Day Smoker    Years: 3.00  . Smokeless tobacco: Never Used  . Tobacco comment: 1 pack of cigarettes weekly   Substance and Sexual Activity  . Alcohol use: Yes    Comment: occasionally   . Drug use: Yes    Types: Marijuana  . Sexual activity: Yes    Birth control/protection: Surgical    Comment: tubal  Lifestyle  . Physical activity:    Days per week: Not on file    Minutes per session: Not on file  . Stress: Not on file  Relationships  . Social connections:    Talks on phone: Not on file    Gets together: Not on file    Attends religious service: Not on file    Active member of club or organization: Not on file    Attends meetings of clubs or organizations: Not on file    Relationship status: Not on file  .  Intimate partner violence:    Fear of current or ex partner: Not on file    Emotionally abused: Not on file    Physically abused: Not on file    Forced sexual activity: Not on file  Other Topics Concern  . Not on file  Social History Narrative  . Not on file     Physical Exam  Vital Signs and Nursing Notes reviewed Vitals:   08/15/18 1315 08/15/18 1330  BP:  104/66  Pulse: 71 66  Resp:    Temp:    SpO2: 99% 100%    CONSTITUTIONAL: Well-appearing, NAD NEURO:  Alert and oriented x 3, normal and symmetric strength and sensation, normal coordination, no meningismus, no speech issues EYES:  eyes equal and reactive ENT/NECK:  no LAD, no JVD CARDIO: Regular rate, well-perfused, normal S1 and S2 PULM:  CTAB no wheezing or rhonchi GI/GU:  normal bowel sounds, non-distended, non-tender MSK/SPINE:  No gross  deformities, no edema SKIN:  no rash, atraumatic PSYCH:  Appropriate speech and behavior  Diagnostic and Interventional Summary    Labs Reviewed - No data to display  No orders to display    Medications  ketorolac (TORADOL) 15 MG/ML injection 15 mg (15 mg Intravenous Given 08/15/18 1216)  diphenhydrAMINE (BENADRYL) injection 25 mg (25 mg Intravenous Given 08/15/18 1216)  prochlorperazine (COMPAZINE) injection 10 mg (10 mg Intravenous Given 08/15/18 1215)  sodium chloride 0.9 % bolus 1,000 mL (1,000 mLs Intravenous New Bag/Given 08/15/18 1219)     Procedures Critical Care  ED Course and Medical Decision Making  I have reviewed the triage vital signs and the nursing notes.  Pertinent labs & imaging results that were available during my care of the patient were reviewed by me and considered in my medical decision making (see below for details).  Favoring uncomplicated migraine headache in this 33 year old female with history of migraines.  History of tubal ligation, no need for pregnancy test today.  There is this questionable report of fever at home, but I tend to agree with the patient that this was likely not a true fever and it was elevated in the setting of distress due to pain.  She has no meningismus, normal neurological exam, no other infectious symptoms.  No fever here.  Will monitor for fever, provide migraine cocktail, reassess.  Patient is feeling much better after migraine cocktail, continued reassuring neurological exam, evaluated here in the emergency department for 3 to 4 hours with no fever.  Patient is appropriate for discharge, advised PCP follow-up to discuss preventative medications.  After the discussed management above, the patient was determined to be safe for discharge.  The patient was in agreement with this plan and all questions regarding their care were answered.  ED return precautions were discussed and the patient will return to the ED with any significant  worsening of condition.  Elmer SowMichael M. Pilar PlateBero, MD [facility] [email or web address]  Final Clinical Impressions(s) / ED Diagnoses     ICD-10-CM   1. Migraine without aura and without status migrainosus, not intractable G43.009     ED Discharge Orders         Ordered    prochlorperazine (COMPAZINE) 10 MG tablet  2 times daily PRN     08/15/18 1426             Sabas SousBero, Dyke Weible M, MD 08/15/18 1428

## 2018-08-15 NOTE — ED Triage Notes (Signed)
Pt reports last night before bed she started to feel a migraine coming on so she just went to bed. This morning she woke up and had a severe left sided headache and couldn't open her left eye. Pt reports her temperature at home was 105. Pt took Excedrin at home and then went to work. Pt reports they take her temperature at work whenever she arrives for Covid-19 screening and they found her temperature to be 100.5 and sent her to the ED for evaluation. Pt denies cough, SOB, muscle aches, sore throat, n/v/d, loss of sense of smell and taste.

## 2018-10-02 ENCOUNTER — Telehealth: Payer: Self-pay | Admitting: Obstetrics and Gynecology

## 2018-10-02 NOTE — Telephone Encounter (Signed)
Patient called, she's having itching and burning x 2 days.  She'd like an appointment.  Please advise.  [phone]

## 2018-10-03 ENCOUNTER — Ambulatory Visit: Payer: Managed Care, Other (non HMO)

## 2018-10-03 NOTE — Telephone Encounter (Signed)
Tried to reach patient, mailbox is not set up.  If patient calls back, need to schedule a self nuswab plus per Knute Neu.

## 2018-10-19 ENCOUNTER — Other Ambulatory Visit: Payer: Self-pay

## 2018-10-19 ENCOUNTER — Observation Stay (HOSPITAL_COMMUNITY)
Admission: EM | Admit: 2018-10-19 | Discharge: 2018-10-21 | Disposition: A | Discharge status: Home / Self Care | Payer: Managed Care, Other (non HMO) | Attending: Internal Medicine | Admitting: Internal Medicine

## 2018-10-19 ENCOUNTER — Emergency Department (HOSPITAL_COMMUNITY): Payer: Managed Care, Other (non HMO)

## 2018-10-19 ENCOUNTER — Encounter (HOSPITAL_COMMUNITY): Payer: Self-pay | Admitting: Emergency Medicine

## 2018-10-19 DIAGNOSIS — F1910 Other psychoactive substance abuse, uncomplicated: Secondary | ICD-10-CM | POA: Insufficient documentation

## 2018-10-19 DIAGNOSIS — R4702 Dysphasia: Secondary | ICD-10-CM | POA: Diagnosis not present

## 2018-10-19 DIAGNOSIS — F1210 Cannabis abuse, uncomplicated: Secondary | ICD-10-CM

## 2018-10-19 DIAGNOSIS — R471 Dysarthria and anarthria: Secondary | ICD-10-CM

## 2018-10-19 DIAGNOSIS — I639 Cerebral infarction, unspecified: Secondary | ICD-10-CM

## 2018-10-19 DIAGNOSIS — Z20828 Contact with and (suspected) exposure to other viral communicable diseases: Secondary | ICD-10-CM | POA: Insufficient documentation

## 2018-10-19 DIAGNOSIS — R531 Weakness: Secondary | ICD-10-CM

## 2018-10-19 DIAGNOSIS — R0602 Shortness of breath: Secondary | ICD-10-CM

## 2018-10-19 DIAGNOSIS — G8194 Hemiplegia, unspecified affecting left nondominant side: Secondary | ICD-10-CM | POA: Insufficient documentation

## 2018-10-19 DIAGNOSIS — F419 Anxiety disorder, unspecified: Secondary | ICD-10-CM | POA: Insufficient documentation

## 2018-10-19 DIAGNOSIS — G43809 Other migraine, not intractable, without status migrainosus: Principal | ICD-10-CM | POA: Insufficient documentation

## 2018-10-19 DIAGNOSIS — F329 Major depressive disorder, single episode, unspecified: Secondary | ICD-10-CM | POA: Insufficient documentation

## 2018-10-19 DIAGNOSIS — R1030 Lower abdominal pain, unspecified: Secondary | ICD-10-CM | POA: Diagnosis not present

## 2018-10-19 DIAGNOSIS — R4781 Slurred speech: Secondary | ICD-10-CM | POA: Diagnosis present

## 2018-10-19 DIAGNOSIS — F17200 Nicotine dependence, unspecified, uncomplicated: Secondary | ICD-10-CM | POA: Diagnosis not present

## 2018-10-19 DIAGNOSIS — F1410 Cocaine abuse, uncomplicated: Secondary | ICD-10-CM

## 2018-10-19 LAB — CBC
HCT: 36.7 % (ref 36.0–46.0)
Hemoglobin: 11.6 g/dL — ABNORMAL LOW (ref 12.0–15.0)
MCH: 28.8 pg (ref 26.0–34.0)
MCHC: 31.6 g/dL (ref 30.0–36.0)
MCV: 91.1 fL (ref 80.0–100.0)
Platelets: 270 10*3/uL (ref 150–400)
RBC: 4.03 MIL/uL (ref 3.87–5.11)
RDW: 13.1 % (ref 11.5–15.5)
WBC: 13.7 10*3/uL — ABNORMAL HIGH (ref 4.0–10.5)
nRBC: 0 % (ref 0.0–0.2)

## 2018-10-19 LAB — COMPREHENSIVE METABOLIC PANEL
ALT: 15 U/L (ref 0–44)
AST: 15 U/L (ref 15–41)
Albumin: 4.7 g/dL (ref 3.5–5.0)
Alkaline Phosphatase: 43 U/L (ref 38–126)
Anion gap: 9 (ref 5–15)
BUN: 7 mg/dL (ref 6–20)
CO2: 23 mmol/L (ref 22–32)
Calcium: 9.2 mg/dL (ref 8.9–10.3)
Chloride: 105 mmol/L (ref 98–111)
Creatinine, Ser: 0.72 mg/dL (ref 0.44–1.00)
GFR calc Af Amer: >60 mL/min (ref >60)
GFR calc non Af Amer: >60 mL/min (ref >60)
Glucose, Bld: 97 mg/dL (ref 70–99)
Potassium: 3.4 mmol/L — ABNORMAL LOW (ref 3.5–5.1)
Sodium: 137 mmol/L (ref 135–145)
Total Bilirubin: 0.8 mg/dL (ref 0.3–1.2)
Total Protein: 7.9 g/dL (ref 6.5–8.1)

## 2018-10-19 LAB — DIFFERENTIAL
Abs Immature Granulocytes: 0.03 10*3/uL (ref 0.00–0.07)
Basophils Absolute: 0.1 10*3/uL (ref 0.0–0.1)
Basophils Relative: 1 %
Eosinophils Absolute: 1 10*3/uL — ABNORMAL HIGH (ref 0.0–0.5)
Eosinophils Relative: 7 %
Immature Granulocytes: 0 %
Lymphocytes Relative: 25 %
Lymphs Abs: 3.3 10*3/uL (ref 0.7–4.0)
Monocytes Absolute: 0.8 10*3/uL (ref 0.1–1.0)
Monocytes Relative: 6 %
Neutro Abs: 8.4 10*3/uL — ABNORMAL HIGH (ref 1.7–7.7)
Neutrophils Relative %: 61 %

## 2018-10-19 LAB — I-STAT CHEM 8, ED
BUN: 5 mg/dL — ABNORMAL LOW (ref 6–20)
Calcium, Ion: 1.14 mmol/L — ABNORMAL LOW (ref 1.15–1.40)
Chloride: 105 mmol/L (ref 98–111)
Creatinine, Ser: 0.7 mg/dL (ref 0.44–1.00)
Glucose, Bld: 93 mg/dL (ref 70–99)
HCT: 38 % (ref 36.0–46.0)
Hemoglobin: 12.9 g/dL (ref 12.0–15.0)
Potassium: 3.5 mmol/L (ref 3.5–5.1)
Sodium: 140 mmol/L (ref 135–145)
TCO2: 24 mmol/L (ref 22–32)

## 2018-10-19 LAB — I-STAT BETA HCG BLOOD, ED (MC, WL, AP ONLY): I-stat hCG, quantitative: <5 m[IU]/mL (ref <5)

## 2018-10-19 LAB — PROTIME-INR
INR: 1.2 (ref 0.8–1.2)
Prothrombin Time: 15.2 seconds (ref 11.4–15.2)

## 2018-10-19 LAB — CBG MONITORING, ED: Glucose-Capillary: 90 mg/dL (ref 70–99)

## 2018-10-19 LAB — POC URINE PREG, ED: Preg Test, Ur: NEGATIVE

## 2018-10-19 LAB — SARS CORONAVIRUS 2 BY RT PCR (HOSPITAL ORDER, PERFORMED IN ~~LOC~~ HOSPITAL LAB): SARS Coronavirus 2: NEGATIVE

## 2018-10-19 LAB — APTT: aPTT: 29 seconds (ref 24–36)

## 2018-10-19 LAB — ETHANOL: Alcohol, Ethyl (B): <10 mg/dL (ref <10)

## 2018-10-19 NOTE — Consult Note (Signed)
[facility] Consult Services   Date of Service:   10/19/2018 21:29:00  Impression:     .  Rule Out Acute Ischemic Stroke  Comments/Sign-Out: her symptoms were preceded by a migraine so this could be type of complicated migraine, although migraine related stroke is not completely ruled out. Her current NIH score is 1 and her syymptoms are resolving even in the 10 minutes that I spent the video with her so I did not give her TPA. She would recommend however she started on aspirin and have not an MRI of her head to determine if there is any cerebrovascular ischemia or not. Additionally she also feels like she is having a panic attack which would be another explanation for her symptoms potentially.  Metrics: Last Known Well: 10/19/2018 19:00:00 [facility] Notification Time: 10/19/2018 21:29:00 Arrival Time: 10/19/2018 21:16:00 Stamp Time: 10/19/2018 21:29:00 Time First Login Attempt: 10/19/2018 21:33:25 Video Start Time: 10/19/2018 21:33:25  Symptoms: left side numbness NIHSS Start Assessment Time: 10/19/2018 21:38:15 Patient is not a candidate for tPA. Patient was not deemed candidate for tPA thrombolytics because of Resolved symptoms (no residual disabling symptoms). Video End Time: 10/19/2018 21:46:12  CT head showed no acute hemorrhage or acute core infarct.  Clinical Presentation is not Suggestive of Large Vessel Occlusive Disease  ED Physician notified of diagnostic impression and management plan on 10/19/2018 21:55:44  Our recommendations are outlined below.  Recommendations:     .  Activate Stroke Protocol Admission/Order Set     .  Stroke/Telemetry Floor     .  Neuro Checks     .  Bedside Swallow Eval     .  DVT Prophylaxis     .  IV Fluids, Normal Saline     .  Head of Bed 30 Degrees     .  Euglycemia and Avoid Hyperthermia (PRN Acetaminophen)     .  Initiate Aspirin 81 MG Daily     .  MRI head  Routine Consultation with [facility] for  Follow up Care  Sign Out:     .  Discussed with Emergency Department Provider    ------------------------------------------------------------------------------  History of Present Illness: Patient is a 33 year old Female.  Patient was brought by private transportation with symptoms of left side numbness  today she experienced a migraine around 6 PM. Around 7 PM she felt like her left side was heavy and somewhat. Her symptoms are significantly improving. She also felt like she was having a panic attack that she never had the left-sided symptoms with a panic attack in the past so she felt like this needed medical attention. She denies taking any prescription medications or any recreational drugs or any supplements  There is no history of hemorrhagic complications or intracranial hemorrhage. There is no history of Recent Anticoagulants. There is no history of recent major surgery. There is no history of recent stroke.  Examination: BP(105/56), 1A: Level of Consciousness - Alert; keenly responsive + 0 1B: Ask Month and Age - Both Questions Right + 0 1C: Blink Eyes & Squeeze Hands - Performs Both Tasks + 0 2: Test Horizontal Extraocular Movements - Normal + 0 3: Test Visual Fields - No Visual Loss + 0 4: Test Facial Palsy (Use Grimace if Obtunded) - Normal symmetry + 0 5A: Test Left Arm Motor Drift - No Drift for 10 Seconds + 0 5B: Test Right Arm Motor Drift - No Drift for 10 Seconds + 0 6A: Test Left Leg Motor Drift - No  Drift for 5 Seconds + 0 6B: Test Right Leg Motor Drift - No Drift for 5 Seconds + 0 7: Test Limb Ataxia (FNF/Heel-Shin) - No Ataxia + 0 8: Test Sensation - Mild-Moderate Loss: Less Sharp/More Dull + 1 9: Test Language/Aphasia - Normal; No aphasia + 0 10: Test Dysarthria - Normal + 0 11: Test Extinction/Inattention - No abnormality + 0  NIHSS Score: 1   Due to the immediate potential for life-threatening deterioration due to underlying acute neurologic illness, I  spent 35 minutes providing critical care. This time includes time for face to face visit via telemedicine, review of medical records, imaging studies and discussion of findings with providers, the patient and/or family.   Dr Georgina Snellhristopher Freddi Schrager   [facility] [phone]   Case [id number]

## 2018-10-19 NOTE — ED Notes (Signed)
CODE STROKE PAGED @ 2120

## 2018-10-19 NOTE — ED Provider Notes (Signed)
[facility] EMERGENCY DEPARTMENT Provider Note   CSN: [id number] Arrival date & time: 10/19/18  2116  An emergency department physician performed an initial assessment on this suspected stroke patient at 2123.  History   Chief Complaint Chief Complaint  Patient presents with   Code Stroke    HPI Anna Gregory is a 33 y.o. female.     Patient states acute onset of slurred speech and left upper extremity left lower extremity weakness at 7 PM.  Patient has a history of migraines had one earlier today took her migraine medicine.  Headache resolved at 6 PM.  And then the symptoms started.  Based on the timing the presentation patient met criteria for code stroke initiation.     Past Medical History:  Diagnosis Date   Chlamydia    Depression    PONV (postoperative nausea and vomiting)    Trichomonas    Urinary tract infection     Patient Active Problem List   Diagnosis Date Noted   Encounter for well woman exam with routine gynecological exam 11/28/2017   Depression 12/16/2011    Class: Chronic   Cannabis use, uncomplicated 32/35/5732    Class: Chronic   Supervision of other normal pregnancy 10/29/2010   Blunt trauma of abdominal wall 10/29/2010    Past Surgical History:  Procedure Laterality Date   CESAREAN SECTION     TUBAL LIGATION  01/10/2011   Procedure: POST PARTUM TUBAL LIGATION;  Surgeon: Mora Bellman, MD;  Location: [facility];  Service: Gynecology;  Laterality: Bilateral;  Bilateral post partum tubal ligation.     OB History    Gravida  7   Para  5   Term  5   Preterm  0   AB  2   Living  5     SAB  1   TAB  1   Ectopic      Multiple      Live Births  5            Home Medications    Prior to Admission medications   Medication Sig Start Date End Date Taking? Authorizing Provider  prochlorperazine (COMPAZINE) 10 MG tablet Take 1 tablet (10 mg total) by mouth 2 (two) times daily as needed (headache). 08/15/18   Maudie Flakes, MD    Family History Family History  Problem Relation Age of Onset   Lung cancer Mother    Hypertension Maternal Grandmother    Diabetes Paternal Grandfather    Aneurysm Paternal Grandmother     Social History Social History   Tobacco Use   Smoking status: Current Some Day Smoker    Years: 3.00   Smokeless tobacco: Never Used   Tobacco comment: 1 pack of cigarettes weekly   Substance Use Topics   Alcohol use: Yes    Comment: occasionally    Drug use: Yes    Types: Marijuana     Allergies   Strawberry extract, Dilaudid [hydromorphone hcl], and Aspirin   Review of Systems Review of Systems  Constitutional: Negative for chills and fever.  HENT: Negative for congestion, rhinorrhea, sore throat and trouble swallowing.   Eyes: Negative for photophobia and visual disturbance.  Respiratory: Negative for cough and shortness of breath.   Cardiovascular: Negative for chest pain and leg swelling.  Gastrointestinal: Negative for abdominal pain, diarrhea, nausea and vomiting.  Genitourinary: Negative for dysuria.  Musculoskeletal: Negative for back pain and neck pain.  Skin: Negative for rash.  Neurological: Positive for  speech difficulty, weakness and headaches. Negative for dizziness and light-headedness.  Hematological: Does not bruise/bleed easily.  Psychiatric/Behavioral: Negative for confusion.     Physical Exam Updated Vital Signs BP (!) 105/56 (BP Location: Right Arm)    Pulse 61    Temp 99 F (37.2 C) (Oral)    Resp 16    Ht 1.727 m ('5\' 8"' )    Wt 88.5 kg    LMP 10/19/2018    SpO2 100%    BMI 29.65 kg/m   Physical Exam Vitals signs and nursing note reviewed.  Constitutional:      General: She is not in acute distress.    Appearance: Normal appearance. She is well-developed.  HENT:     Head: Normocephalic and atraumatic.  Eyes:     Extraocular Movements: Extraocular movements intact.     Conjunctiva/sclera: Conjunctivae normal.     Pupils:  Pupils are equal, round, and reactive to light.  Neck:     Musculoskeletal: Normal range of motion and neck supple.  Cardiovascular:     Rate and Rhythm: Normal rate and regular rhythm.     Heart sounds: No murmur.  Pulmonary:     Effort: Pulmonary effort is normal. No respiratory distress.     Breath sounds: Normal breath sounds.  Abdominal:     Palpations: Abdomen is soft.     Tenderness: There is no abdominal tenderness.  Musculoskeletal: Normal range of motion.  Skin:    General: Skin is warm and dry.     Capillary Refill: Capillary refill takes less than 2 seconds.  Neurological:     Mental Status: She is alert.     Cranial Nerves: Cranial nerve deficit present.     Sensory: Sensory deficit present.     Motor: Weakness present.     Comments: Patient with some slurred speech.  No facial muscle weaknesses at all.  Weakness to the left upper extremity greater than left lower extremity.  Extraocular muscles intact.  Good range of motion of the neck.  Right side is normal.  Also some decreased sensation to the left upper arm.      ED Treatments / Results  Labs (all labs ordered are listed, but only abnormal results are displayed) Labs Reviewed  CBC - Abnormal; Notable for the following components:      Result Value   WBC 13.7 (*)    Hemoglobin 11.6 (*)    All other components within normal limits  DIFFERENTIAL - Abnormal; Notable for the following components:   Neutro Abs 8.4 (*)    Eosinophils Absolute 1.0 (*)    All other components within normal limits  COMPREHENSIVE METABOLIC PANEL - Abnormal; Notable for the following components:   Potassium 3.4 (*)    All other components within normal limits  I-STAT CHEM 8, ED - Abnormal; Notable for the following components:   BUN 5 (*)    Calcium, Ion 1.14 (*)    All other components within normal limits  ETHANOL  PROTIME-INR  APTT  RAPID URINE DRUG SCREEN, HOSP PERFORMED  URINALYSIS, ROUTINE W REFLEX MICROSCOPIC  CBG  MONITORING, ED  I-STAT BETA HCG BLOOD, ED ([facility], [facility], [facility] ONLY)  POC URINE PREG, ED    EKG EKG Interpretation  Date/Time:  Thursday October 19 2018 21:25:44 EDT Ventricular Rate:  73 PR Interval:    QRS Duration: 68 QT Interval:  382 QTC Calculation: 421 R Axis:   35 Text Interpretation:  Sinus rhythm Probable left atrial enlargement Confirmed by  Fredia Sorrow [phone]) on 10/19/2018 9:28:49 PM   Radiology Ct Head Code Stroke Wo Contrast  Result Date: 10/19/2018 CLINICAL DATA:  Code stroke. Initial evaluation for acute left-sided weakness. EXAM: CT HEAD WITHOUT CONTRAST TECHNIQUE: Contiguous axial images were obtained from the base of the skull through the vertex without intravenous contrast. COMPARISON:  Prior CT from 06/23/2013. FINDINGS: Brain: Cerebral volume within normal limits for patient age. No evidence for acute intracranial hemorrhage. No findings to suggest acute large vessel territory infarct. No mass lesion, midline shift, or mass effect. Ventricles are normal in size without evidence for hydrocephalus. No extra-axial fluid collection identified. Chiari 1 malformation noted. Vascular: No hyperdense vessel identified. Skull: Scalp soft tissues demonstrate no acute abnormality. Calvarium intact. Sinuses/Orbits: Globes and orbital soft tissues within normal limits. Visualized paranasal sinuses are clear. Chronic bilateral nasal bone fractures noted. No mastoid effusion. ASPECTS [facility] Stroke Program Early CT Score) - Ganglionic level infarction (caudate, lentiform nuclei, internal capsule, insula, M1-M3 cortex): 7 - Supraganglionic infarction (M4-M6 cortex): 3 Total score (0-10 with 10 being normal): 10 IMPRESSION: 1. No acute intracranial infarct or other abnormality identified. 2. ASPECTS is 10. 3. Chiari 1 malformation. Critical Value/emergent results were called by telephone at the time of interpretation on 10/19/2018 at 9:41 pm to Dr. Fredia Sorrow , who verbally acknowledged these  results. Electronically Signed   By: Jeannine Boga M.D.   On: 10/19/2018 21:45    Procedures Procedures (including critical care time)  CRITICAL CARE Performed by: Fredia Sorrow Total critical care time: 30 minutes Critical care time was exclusive of separately billable procedures and treating other patients. Critical care was necessary to treat or prevent imminent or life-threatening deterioration. Critical care was time spent personally by me on the following activities: development of treatment plan with patient and/or surrogate as well as nursing, discussions with consultants, evaluation of patient's response to treatment, examination of patient, obtaining history from patient or surrogate, ordering and performing treatments and interventions, ordering and review of laboratory studies, ordering and review of radiographic studies, pulse oximetry and re-evaluation of patient's condition.   Medications Ordered in ED Medications - No data to display   Initial Impression / Assessment and Plan / ED Course  I have reviewed the triage vital signs and the nursing notes.  Pertinent labs & imaging results that were available during my care of the patient were reviewed by me and considered in my medical decision making (see chart for details).       Patient has a history of aspirin allergy resulting in hives so we will not give aspirin.  Tele-neurologist recommended admission and MRI in the morning did not recommend CTA or perfusion studies.  tPA not recommended.  Patient's NIH score right now is a 1.  Patient arrived with symptoms and findings consistent with code stroke.  Onset of symptoms were at 7 PM.  Patient had speech difficulty and weakness to left arm and left leg.  Patient had history of migraines had a headache earlier in the day but it resolved with her migraine medicine at 6 PM.  Patient is never had a migraine do anything like this.  Also sounds as if there may have been a  panic attack.  Patient got very anxious.  Tele-neurologist recommends admission and MRI in the morning.  Discussed with hospitalist who will admit.  Patient showing significant improvement.  He did not recommend any further studies at this time.  Initial CT head was negative.  COVID testing is negative.  Final Clinical Impressions(s) / ED Diagnoses   Final diagnoses:  Cerebrovascular accident (CVA), unspecified mechanism [facility])    ED Discharge Orders    None       Fredia Sorrow, MD 10/19/18 2329

## 2018-10-19 NOTE — ED Triage Notes (Addendum)
Pt reports onset approx 7 pm, felt sob initially and was drooling.  Pt thought she was having a panic attack and now having weakness to her left side.  Pt states she had a migraine earlier and took her migraine meds prior to symptoms starting.

## 2018-10-20 ENCOUNTER — Other Ambulatory Visit: Payer: Self-pay

## 2018-10-20 ENCOUNTER — Observation Stay (HOSPITAL_COMMUNITY): Payer: Managed Care, Other (non HMO)

## 2018-10-20 ENCOUNTER — Observation Stay (HOSPITAL_BASED_OUTPATIENT_CLINIC_OR_DEPARTMENT_OTHER): Payer: Managed Care, Other (non HMO)

## 2018-10-20 ENCOUNTER — Encounter (HOSPITAL_COMMUNITY): Payer: Self-pay | Admitting: *Deleted

## 2018-10-20 DIAGNOSIS — G459 Transient cerebral ischemic attack, unspecified: Secondary | ICD-10-CM | POA: Diagnosis not present

## 2018-10-20 DIAGNOSIS — R471 Dysarthria and anarthria: Secondary | ICD-10-CM

## 2018-10-20 DIAGNOSIS — R531 Weakness: Secondary | ICD-10-CM

## 2018-10-20 DIAGNOSIS — F1210 Cannabis abuse, uncomplicated: Secondary | ICD-10-CM

## 2018-10-20 LAB — HEMOGLOBIN A1C
Hgb A1c MFr Bld: 5.2 % (ref 4.8–5.6)
Mean Plasma Glucose: 102.54 mg/dL

## 2018-10-20 LAB — URINALYSIS, ROUTINE W REFLEX MICROSCOPIC
Bilirubin Urine: NEGATIVE
Glucose, UA: NEGATIVE mg/dL
Ketones, ur: 20 mg/dL — AB
Leukocytes,Ua: NEGATIVE
Nitrite: POSITIVE — AB
Protein, ur: 30 mg/dL — AB
Specific Gravity, Urine: 1.03 (ref 1.005–1.030)
pH: 6 (ref 5.0–8.0)

## 2018-10-20 LAB — RAPID URINE DRUG SCREEN, HOSP PERFORMED
Amphetamines: NOT DETECTED
Barbiturates: NOT DETECTED
Benzodiazepines: NOT DETECTED
Cocaine: POSITIVE — AB
Opiates: NOT DETECTED
Tetrahydrocannabinol: POSITIVE — AB

## 2018-10-20 LAB — ECHOCARDIOGRAM COMPLETE
Height: 68 in
Weight: 3120 oz

## 2018-10-20 LAB — LIPID PANEL
Cholesterol: 117 mg/dL (ref 0–200)
HDL: 52 mg/dL (ref >40)
LDL Cholesterol: 58 mg/dL (ref 0–99)
Total CHOL/HDL Ratio: 2.3 RATIO
Triglycerides: 33 mg/dL (ref <150)
VLDL: 7 mg/dL (ref 0–40)

## 2018-10-20 MED ORDER — TOPIRAMATE 25 MG PO TABS
25.0000 mg | ORAL_TABLET | Freq: Two times a day (BID) | ORAL | Status: DC
  Administered 2018-10-20 – 2018-10-21 (×2): 25 mg via ORAL
  Filled 2018-10-20 (×2): qty 1

## 2018-10-20 MED ORDER — KETOROLAC TROMETHAMINE 30 MG/ML IJ SOLN
30.0000 mg | Freq: Four times a day (QID) | INTRAMUSCULAR | Status: DC
  Administered 2018-10-20 – 2018-10-21 (×4): 30 mg via INTRAVENOUS
  Filled 2018-10-20 (×4): qty 1

## 2018-10-20 MED ORDER — ACETAMINOPHEN 160 MG/5ML PO SOLN: 650.0000 mg | ORAL | Status: DC | PRN

## 2018-10-20 MED ORDER — METOCLOPRAMIDE HCL 5 MG/ML IJ SOLN
10.0000 mg | Freq: Four times a day (QID) | INTRAMUSCULAR | Status: DC
  Administered 2018-10-20 – 2018-10-21 (×4): 10 mg via INTRAVENOUS
  Filled 2018-10-20 (×4): qty 2

## 2018-10-20 MED ORDER — SENNOSIDES-DOCUSATE SODIUM 8.6-50 MG PO TABS
1.0000 | ORAL_TABLET | Freq: Every evening | ORAL | Status: DC | PRN
  Filled 2018-10-20: qty 1

## 2018-10-20 MED ORDER — ACETAMINOPHEN 650 MG RE SUPP: 650.0000 mg | RECTAL | Status: DC | PRN

## 2018-10-20 MED ORDER — STROKE: EARLY STAGES OF RECOVERY BOOK
Freq: Once | Status: AC
  Administered 2018-10-20: 18:00:00
  Filled 2018-10-20: qty 1

## 2018-10-20 MED ORDER — SODIUM CHLORIDE 0.9 % IV SOLN: INTRAVENOUS | Status: DC

## 2018-10-20 MED ORDER — HEPARIN SODIUM (PORCINE) 5000 UNIT/ML IJ SOLN
5000.0000 [IU] | Freq: Three times a day (TID) | INTRAMUSCULAR | Status: DC
  Administered 2018-10-20 – 2018-10-21 (×4): 5000 [IU] via SUBCUTANEOUS
  Filled 2018-10-20 (×4): qty 1

## 2018-10-20 MED ORDER — IBUPROFEN 400 MG PO TABS: 200.0000 mg | ORAL_TABLET | Freq: Four times a day (QID) | ORAL | Status: DC | PRN

## 2018-10-20 MED ORDER — HYDROXYZINE HCL 25 MG PO TABS: 50.0000 mg | ORAL_TABLET | Freq: Three times a day (TID) | ORAL | Status: DC | PRN

## 2018-10-20 MED ORDER — ACETAMINOPHEN 325 MG PO TABS: 650.0000 mg | ORAL_TABLET | ORAL | Status: DC | PRN

## 2018-10-20 NOTE — Clinical Social Work Note (Addendum)
Met with patient to talk about PT recommendations.  Mother at bedside and patient gives permission for us to talk together.  Patient expresses preference for Outpatient PT at Clinic vs HH.  Pt also accepts recommendation of rolling walker by PT.  CSW contacted Kathy Cheek with [employer] today and asked for delivery. Confirmed. Referred patient to Dr Fanta as new patient.  Office is closed, but I faxed the requested information per protocol. 

## 2018-10-20 NOTE — H&P (Signed)
TRH H&P    Patient Demographics:    Anna Gregory, is a 33 y.o. female  MRN: [id number]  DOB - 10-27-1985  Admit Date - 10/19/2018  Referring MD/NP/PA: Dr. Deretha Emory  Outpatient Primary MD for the patient is Patient, No Pcp Per  Patient coming from: Home   Chief complaint- left side weakness and speech difficulty   HPI:    Anna Gregory  is a 33 y.o. female, with no known medical history, presents to the ED today with left-sided weakness and speech difficulty.  Patient reports that she had a migraine earlier in the day, and she took medication for that.  She has migraines occasionally but cannot quantify if they are once weekly once monthly or what the interval usually is.  Her migraine started to resolve around 6 PM, and then at 7 PM she was in her car and started to have shortness of breath palpitations and dizziness.  Patient thought it was a panic attack.  She tried to get out of her car, but could not because of her left-sided weakness and numbness.  At that time she also started to notice that she was having difficulty speaking.  Her difficulty is that she has to concentrate to get the words correct.  She is not having slurred speech.  She is not having trouble swallowing.  She is not having a facial droop.  Patient reports that these symptoms progressed over 2 hours.  Patient has never had something like this before. When she presented to the ED code stroke was called and the tele-neurologist was consulted.  Tele-neurologist gave her an NIH scale score of 1.  Apparently left-sided weakness significantly improved during tele-neurologist evaluation. Neurologist reviewed the CT scan, which showed no acute intracranial infarct or other abnormality identified.  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  Neurologist does not recommend a CTA, or TPA, but they do recommend an MRI in the morning.  Patient is very anxious but  agrees with this plan.  She has been crying a lot in the ED.  Patient does not associate this with her symptoms, but she does smoke marijuana daily.  She reports last time she smoked marijuana was yesterday.  It is from the same dealer.  She does not suspect any contaminants in her marijuana.  Patient's LMP is October 13, 2018.  Patient had tubal ligation.  Urine pregnancy is negative.    Review of systems:    In addition to the HPI above,  No Fever-chills, Headache, photophobia, dizziness No problems swallowing food or Liquids, No Chest pain, Cough positive for shortness of breath and palpitations No Abdominal pain, No Nausea or Vomiting, bowel movements are regular, No Blood in stool or Urine, No dysuria, No new skin rashes or bruises, No new joints pains-aches,  Positive for asymmetrical weakness on Left.  No recent weight gain or loss, No polyuria, polydypsia or polyphagia, Positive for significant financial stressors and anxiety  All other systems reviewed and are negative.    Past History of the following :  Past Medical History:  Diagnosis Date   Chlamydia    Depression    PONV (postoperative nausea and vomiting)    Trichomonas    Urinary tract infection       Past Surgical History:  Procedure Laterality Date   CESAREAN SECTION     TUBAL LIGATION  01/10/2011   Procedure: POST PARTUM TUBAL LIGATION;  Surgeon: Catalina Antigua, MD;  Location: [facility];  Service: Gynecology;  Laterality: Bilateral;  Bilateral post partum tubal ligation.      Social History:      Social History   Tobacco Use   Smoking status: Current Some Day Smoker    Years: 3.00   Smokeless tobacco: Never Used   Tobacco comment: 1 pack of cigarettes weekly   Substance Use Topics   Alcohol use: Yes    Comment: occasionally        Family History :     Family History  Problem Relation Age of Onset   Lung cancer Mother    Hypertension Maternal Grandmother    Diabetes  Paternal Grandfather    Aneurysm Paternal Grandmother    Mother believes that grandfather may have had a fatal stroke.    Home Medications:   Prior to Admission medications   Medication Sig Start Date End Date Taking? Authorizing Provider  ibuprofen (ADVIL) 200 MG tablet Take 200 mg by mouth every 6 (six) hours as needed for mild pain or moderate pain.   Yes [provider]  prochlorperazine (COMPAZINE) 10 MG tablet Take 1 tablet (10 mg total) by mouth 2 (two) times daily as needed (headache). 08/15/18  Yes Sabas Sous, MD     Allergies:     Allergies  Allergen Reactions   Strawberry Extract Anaphylaxis and Other (See Comments)    Reaction: throat swelling    Dilaudid [Hydromorphone Hcl] Itching    C/o of itching after receiving 0.5mg  of IV dilaudid today .    Aspirin Hives    Pt can take ibuprofen     Physical Exam:   Vitals  Blood pressure (!) 105/56, pulse 61, temperature 99 F (37.2 C), temperature source Oral, resp. rate 16, height 5\' 8"  (1.727 m), weight 88.5 kg, last menstrual period 10/19/2018, SpO2 100 %.  1.  General: Patient is tearful, lying supine in bed.  2. Psychiatric: Patient is anxious, crying, easily agitated, and admits to significant financial stressors at home.  3. Neurologic: Patient has weakness in her left upper and lower extremity as compared to the right.  She can resist gravity and some resistance in both extremities.  She reports decreased sensation in her left extremities as opposed to her right.  Left patellar reflex is normal, left Achilles reflex is decreased-it is hard to tell if this is because patient is focusing on it.  Left triceps reflex is decreased as well.  Right extremity reflexes are normal.  Patient has equal sensation on face, no facial droop, protruding tongue is midline -cranial nerves II through XII are grossly intact, with the exception of the accessory nerve on the left.  Patient is not able to rotate her head  to the left or shrug  her L shoulder when asked to do so.  4. HEENMT:  Normocephalic atraumatic, no scleral icterus, dry mucous membranes, no masses or lesions of neck.  5. Respiratory : Lungs are clear to auscultation bilaterally with no wheezes or crackles.  6. Cardiovascular : H RRR with no murmurs or rubs or gallops  7. Gastrointestinal:  Abdomen is nontender to palpation and soft.  GU- patient does have suprapubic tenderness.  8. Skin:  No new rashes or lesions on limited skin exam     Data Review:    CBC Recent Labs  Lab 10/19/18 2126 10/19/18 2128  WBC 13.7*  --   HGB 11.6* 12.9  HCT 36.7 38.0  PLT 270  --   MCV 91.1  --   MCH 28.8  --   MCHC 31.6  --   RDW 13.1  --   LYMPHSABS 3.3  --   MONOABS 0.8  --   EOSABS 1.0*  --   BASOSABS 0.1  --    ------------------------------------------------------------------------------------------------------------------  Results for orders placed or performed during the hospital encounter of 10/19/18 (from the past 48 hour(s))  Ethanol     Status: None   Collection Time: 10/19/18  9:26 PM  Result Value Ref Range   Alcohol, Ethyl (B) <10 <10 mg/dL    Comment: (NOTE) Lowest detectable limit for serum alcohol is 10 mg/dL. For medical purposes only. Performed at [facility], [address], [place], [place] [address]   Protime-INR     Status: None   Collection Time: 10/19/18  9:26 PM  Result Value Ref Range   Prothrombin Time 15.2 11.4 - 15.2 seconds   INR 1.2 0.8 - 1.2    Comment: (NOTE) INR goal varies based on device and disease states. Performed at [facility], [address], [place], [place] [address]   APTT     Status: None   Collection Time: 10/19/18  9:26 PM  Result Value Ref Range   aPTT 29 24 - 36 seconds    Comment: Performed at [facility], [address], [place], [place] [address]  CBC     Status: Abnormal   Collection Time: 10/19/18  9:26 PM  Result Value Ref Range   WBC 13.7 (H) 4.0 - 10.5  K/uL   RBC 4.03 3.87 - 5.11 MIL/uL   Hemoglobin 11.6 (L) 12.0 - 15.0 g/dL   HCT 36.7 36.0 - 46.0 %   MCV 91.1 80.0 - 100.0 fL   MCH 28.8 26.0 - 34.0 pg   MCHC 31.6 30.0 - 36.0 g/dL   RDW 13.1 11.5 - 15.5 %   Platelets 270 150 - 400 K/uL   nRBC 0.0 0.0 - 0.2 %    Comment: Performed at [facility], [address], [place], [place] [address]  Differential     Status: Abnormal   Collection Time: 10/19/18  9:26 PM  Result Value Ref Range   Neutrophils Relative % 61 %   Neutro Abs 8.4 (H) 1.7 - 7.7 K/uL   Lymphocytes Relative 25 %   Lymphs Abs 3.3 0.7 - 4.0 K/uL   Monocytes Relative 6 %   Monocytes Absolute 0.8 0.1 - 1.0 K/uL   Eosinophils Relative 7 %   Eosinophils Absolute 1.0 (H) 0.0 - 0.5 K/uL   Basophils Relative 1 %   Basophils Absolute 0.1 0.0 - 0.1 K/uL   Immature Granulocytes 0 %   Abs Immature Granulocytes 0.03 0.00 - 0.07 K/uL    Comment: Performed at [facility], [address] [place], [place] [address]  Comprehensive metabolic panel     Status: Abnormal   Collection Time: 10/19/18  9:26 PM  Result Value Ref Range   Sodium 137 135 - 145 mmol/L   Potassium 3.4 (L) 3.5 - 5.1 mmol/L   Chloride 105 98 - 111 mmol/L   CO2  23 22 - 32 mmol/L   Glucose, Bld 97 70 - 99 mg/dL   BUN 7 6 - 20 mg/dL   Creatinine, Ser 4.090.72 0.44 - 1.00 mg/dL   Calcium 9.2 8.9 - 81.110.3 mg/dL   Total Protein 7.9 6.5 - 8.1 g/dL   Albumin 4.7 3.5 - 5.0 g/dL   AST 15 15 - 41 U/L   ALT 15 0 - 44 U/L   Alkaline Phosphatase 43 38 - 126 U/L   Total Bilirubin 0.8 0.3 - 1.2 mg/dL   GFR calc non Af Amer >60 >60 mL/min   GFR calc Af Amer >60 >60 mL/min   Anion gap 9 5 - 15    Comment: Performed at [facility], [address] [place], [place] [address]  I-Stat beta hCG blood, ED ([facility], [facility], [facility] only)     Status: None   Collection Time: 10/19/18  9:26 PM  Result Value Ref Range   I-stat hCG, quantitative <5.0 <5 mIU/mL   Comment 3            Comment:   GEST. AGE      CONC.  (mIU/mL)   <=1 WEEK        5 -  50     2 WEEKS       50 - 500     3 WEEKS       100 - 10,000     4 WEEKS     1,000 - 30,000        FEMALE AND NON-PREGNANT FEMALE:     LESS THAN 5 mIU/mL   I-stat chem 8, ED     Status: Abnormal   Collection Time: 10/19/18  9:28 PM  Result Value Ref Range   Sodium 140 135 - 145 mmol/L   Potassium 3.5 3.5 - 5.1 mmol/L   Chloride 105 98 - 111 mmol/L   BUN 5 (L) 6 - 20 mg/dL   Creatinine, Ser 2.950.70 0.44 - 1.00 mg/dL   Glucose, Bld 93 70 - 99 mg/dL   Calcium, Ion 6.211.14 (L) 1.15 - 1.40 mmol/L   TCO2 24 22 - 32 mmol/L   Hemoglobin 12.9 12.0 - 15.0 g/dL   HCT 30.838.0 65.736.0 - 84.646.0 %  CBG monitoring, ED     Status: None   Collection Time: 10/19/18  9:34 PM  Result Value Ref Range   Glucose-Capillary 90 70 - 99 mg/dL  SARS Coronavirus 2 (CEPHEID - Performed in [facility] hospital lab), Hosp Order     Status: None   Collection Time: 10/19/18 10:05 PM   Specimen: Nasopharyngeal Swab  Result Value Ref Range   SARS Coronavirus 2 NEGATIVE NEGATIVE    Comment: (NOTE) If result is NEGATIVE SARS-CoV-2 target nucleic acids are NOT DETECTED. The SARS-CoV-2 RNA is generally detectable in upper and lower  respiratory specimens during the acute phase of infection. The lowest  concentration of SARS-CoV-2 viral copies this assay can detect is 250  copies / mL. A negative result does not preclude SARS-CoV-2 infection  and should not be used as the sole basis for treatment or other  patient management decisions.  A negative result may occur with  improper specimen collection / handling, submission of specimen other  than nasopharyngeal swab, presence of viral mutation(s) within the  areas targeted by this assay, and inadequate number of viral copies  (<250 copies / mL). A negative result must be combined with clinical  observations, patient history, and epidemiological information. If result is  POSITIVE SARS-CoV-2 target nucleic acids are DETECTED. The SARS-CoV-2 RNA is generally detectable in upper and  lower  respiratory specimens dur ing the acute phase of infection.  Positive  results are indicative of active infection with SARS-CoV-2.  Clinical  correlation with patient history and other diagnostic information is  necessary to determine patient infection status.  Positive results do  not rule out bacterial infection or co-infection with other viruses. If result is PRESUMPTIVE POSTIVE SARS-CoV-2 nucleic acids MAY BE PRESENT.   A presumptive positive result was obtained on the submitted specimen  and confirmed on repeat testing.  While 2019 novel coronavirus  (SARS-CoV-2) nucleic acids may be present in the submitted sample  additional confirmatory testing may be necessary for epidemiological  and / or clinical management purposes  to differentiate between  SARS-CoV-2 and other Sarbecovirus currently known to infect humans.  If clinically indicated additional testing with an alternate test  methodology [phone]([id number]) is advised. The SARS-CoV-2 RNA is generally  detectable in upper and lower respiratory sp ecimens during the acute  phase of infection. The expected result is Negative. Fact Sheet for Patients:  BoilerBrush.com.cyhttps://www.fda.gov/media/136312/download Fact Sheet for Healthcare Providers: https://pope.com/https://www.fda.gov/media/136313/download This test is not yet approved or cleared by the [place] FDA and has been authorized for detection and/or diagnosis of SARS-CoV-2 by FDA under an Emergency Use Authorization (EUA).  This EUA will remain in effect (meaning this test can be used) for the duration of the COVID-19 declaration under Section 564(b)(1) of the Act, 21 U.S.C. section 360bbb-3(b)(1), unless the authorization is terminated or revoked sooner. Performed at [facility], [address] [place], [place] [address]   POC urine preg, ED     Status: None   Collection Time: 10/19/18 11:51 PM  Result Value Ref Range   Preg Test, Ur NEGATIVE NEGATIVE    Comment:        THE SENSITIVITY OF  THIS METHODOLOGY IS >24 mIU/mL     Chemistries  Recent Labs  Lab 10/19/18 2126 10/19/18 2128  NA 137 140  K 3.4* 3.5  CL 105 105  CO2 23  --   GLUCOSE 97 93  BUN 7 5*  CREATININE 0.72 0.70  CALCIUM 9.2  --   AST 15  --   ALT 15  --   ALKPHOS 43  --   BILITOT 0.8  --    ------------------------------------------------------------------------------------------------------------------  ------------------------------------------------------------------------------------------------------------------ GFR: Estimated Creatinine Clearance: 117.5 mL/min (by C-G formula based on SCr of 0.7 mg/dL). Liver Function Tests: Recent Labs  Lab 10/19/18 2126  AST 15  ALT 15  ALKPHOS 43  BILITOT 0.8  PROT 7.9  ALBUMIN 4.7   No results for input(s): LIPASE, AMYLASE in the last 168 hours. No results for input(s): AMMONIA in the last 168 hours. Coagulation Profile: Recent Labs  Lab 10/19/18 2126  INR 1.2   Cardiac Enzymes: No results for input(s): CKTOTAL, CKMB, CKMBINDEX, TROPONINI in the last 168 hours. BNP (last 3 results) No results for input(s): PROBNP in the last 8760 hours. HbA1C: No results for input(s): HGBA1C in the last 72 hours. CBG: Recent Labs  Lab 10/19/18 2134  GLUCAP 90   Lipid Profile: No results for input(s): CHOL, HDL, LDLCALC, TRIG, CHOLHDL, LDLDIRECT in the last 72 hours. Thyroid Function Tests: No results for input(s): TSH, T4TOTAL, FREET4, T3FREE, THYROIDAB in the last 72 hours. Anemia Panel: No results for input(s): VITAMINB12, FOLATE, FERRITIN, TIBC, IRON, RETICCTPCT in the last 72 hours.  --------------------------------------------------------------------------------------------------------------- Urine analysis:    Component  Value Date/Time   COLORURINE AMBER (A) 02/19/2016 1549   APPEARANCEUR CLOUDY (A) 02/19/2016 1549   LABSPEC 1.036 (H) 02/19/2016 1549   PHURINE 6.0 02/19/2016 1549   GLUCOSEU NEGATIVE 02/19/2016 1549   HGBUR NEGATIVE  02/19/2016 1549   BILIRUBINUR SMALL (A) 02/19/2016 1549   KETONESUR NEGATIVE 02/19/2016 1549   PROTEINUR NEGATIVE 02/19/2016 1549   UROBILINOGEN 1.0 08/27/2014 0136   NITRITE NEGATIVE 02/19/2016 1549   LEUKOCYTESUR NEGATIVE 02/19/2016 1549      Imaging Results:    Ct Head Code Stroke Wo Contrast  Result Date: 10/19/2018 CLINICAL DATA:  Code stroke. Initial evaluation for acute left-sided weakness. EXAM: CT HEAD WITHOUT CONTRAST TECHNIQUE: Contiguous axial images were obtained from the base of the skull through the vertex without intravenous contrast. COMPARISON:  Prior CT from 06/23/2013. FINDINGS: Brain: Cerebral volume within normal limits for patient age. No evidence for acute intracranial hemorrhage. No findings to suggest acute large vessel territory infarct. No mass lesion, midline shift, or mass effect. Ventricles are normal in size without evidence for hydrocephalus. No extra-axial fluid collection identified. Chiari 1 malformation noted. Vascular: No hyperdense vessel identified. Skull: Scalp soft tissues demonstrate no acute abnormality. Calvarium intact. Sinuses/Orbits: Globes and orbital soft tissues within normal limits. Visualized paranasal sinuses are clear. Chronic bilateral nasal bone fractures noted. No mastoid effusion. ASPECTS [facility]([place] Stroke Program Early CT Score) - Ganglionic level infarction (caudate, lentiform nuclei, internal capsule, insula, M1-M3 cortex): 7 - Supraganglionic infarction (M4-M6 cortex): 3 Total score (0-10 with 10 being normal): 10 IMPRESSION: 1. No acute intracranial infarct or other abnormality identified. 2. ASPECTS is 10. 3. Chiari 1 malformation. Critical Value/emergent results were called by telephone at the time of interpretation on 10/19/2018 at 9:41 pm to Dr. Vanetta MuldersSCOTT ZACKOWSKI , who verbally acknowledged these results. Electronically Signed   By: Rise MuBenjamin  McClintock M.D.   On: 10/19/2018 21:45    My personal review of EKG: Rhythm NSR, Rate73/min, QTc  421 ,no Acute ST changes   Assessment & Plan:    Active Problems:   TIA (transient ischemic attack)   1. Transient ischemic attack 1. Tele-neurologist consulted-MRI in morning.  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  As this would be a diagnosis of exclusion we will do a full TIA work-up.  Ultrasound carotids ordered, echocardiogram ordered, MRI ordered for the morning, lipid panel and hemoglobin A1c also ordered.  Monitoring on telemetry to rule out transient arrhythmia.  2. Anxiety 1. Patient self reports occasional panic attacks.  Patient could likely benefit from an SSRI.  Will order Vistaril for anxiety while in the hospital. 3. Suprapubic tenderness 1. Patient has a leukocytosis and suprapubic tenderness.  We will order a urinalysis and a urine culture.  4. Leukocytosis 1. Likely due to stress response versus UTI.  UA ordered.  Continue to monitor with daily CBC.   DVT Prophylaxis-   Lovenox - SCDs   AM Labs Ordered, also please review Full Orders  Family Communication: Admission, patients condition and plan of care including tests being ordered have been discussed with the patient and mother who indicate understanding and agree with the plan and Code Status.  Code Status:  FULL  Admission status: Observation: Based on patients clinical presentation and evaluation of above clinical data, I have made determination that patient meets Inpatient criteria at this time.  Time spent in minutes : 45   Lilyan GilfordAsia B Zierle-Ghosh M.D on 10/20/2018 at 12:18 AM

## 2018-10-20 NOTE — Plan of Care (Signed)
  Problem: Acute Rehab PT Goals(only PT should resolve) Goal: Pt Will Go Supine/Side To Sit Outcome: Progressing Flowsheets (Taken 10/20/2018 1414) Pt will go Supine/Side to Sit: Independently Goal: Patient Will Perform Sitting Balance Outcome: Progressing Flowsheets (Taken 10/20/2018 1414) Patient will perform sitting balance: with modified independence Goal: Patient Will Transfer Sit To/From Stand Outcome: Progressing Flowsheets (Taken 10/20/2018 1414) Patient will transfer sit to/from stand: with modified independence Goal: Pt Will Ambulate Outcome: Progressing Flowsheets (Taken 10/20/2018 1414) Pt will Ambulate:  50 feet  with rolling walker  with modified independence   2:14 PM, 10/20/18 Lonell Grandchild, MPT Physical Therapist with [facility] [phone] office [phone] mobile phone

## 2018-10-20 NOTE — Evaluation (Signed)
Physical Therapy Evaluation Patient Details Name: Anna Gregory MRN: [id number] DOB: 01/10/1986 Today's Date: 10/20/2018   History of Present Illness  Anna Gregory  is a 33 y.o. female, with no known medical history, presents to the ED today with left-sided weakness and speech difficulty.  Patient reports that she had a migraine earlier in the day, and she took medication for that.  She has migraines occasionally but cannot quantify if they are once weekly once monthly or what the interval usually is.  Her migraine started to resolve around 6 PM, and then at 7 PM she was in her car and started to have shortness of breath palpitations and dizziness.  Patient thought it was a panic attack.  She tried to get out of her car, but could not because of her left-sided weakness and numbness.  At that time she also started to notice that she was having difficulty speaking.  Her difficulty is that she has to concentrate to get the words correct.  She is not having slurred speech.  She is not having trouble swallowing.  She is not having a facial droop.  Patient reports that these symptoms progressed over 2 hours.  Patient has never had something like this before. When she presented to the ED code stroke was called and the tele-neurologist was consulted.  Tele-neurologist gave her an NIH scale score of 1.  Apparently left-sided weakness significantly improved during tele-neurologist evaluation. Neurologist reviewed the CT scan, which showed no acute intracranial infarct or other abnormality identified.  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  Neurologist does not recommend a CTA, or TPA, but they do recommend an MRI in the morning.  Patient is very anxious but agrees with this plan.  She has been crying a lot in the ED.    Clinical Impression  Patient agreeable for therapy, demonstrates slow labored movement for sitting up at bedside, c/o mild decrease in sensation left side from knee down, c/o mild tingling in  LUE below elbow, and decreased strength in left ankle dorsiflexors, labored movement for sitting up at bedside, has to lean on nearby objects for support when not using an AD, required use of RW with good return demonstrated for use without loss of balance.  Patient mostly limited due to c/o fatigue and states she has difficulty speaking when having to talk more than a few minutes.  Patient requested to go back to bed after therapy.  Patient will benefit from continued physical therapy in hospital and recommended venue below to increase strength, balance, endurance for safe ADLs and gait.    Follow Up Recommendations Outpatient PT;Supervision - Intermittent;Supervision for mobility/OOB    Equipment Recommendations  Rolling walker with 5" wheels    Recommendations for Other Services       Precautions / Restrictions Precautions Precautions: Fall Restrictions Weight Bearing Restrictions: No      Mobility  Bed Mobility Overal bed mobility: Modified Independent             General bed mobility comments: increased time, labored movement  Transfers Overall transfer level: Needs assistance Equipment used: Rolling walker (2 wheeled);None Transfers: Sit to/from [employer]Stand;Stand Pivot Transfers Sit to Stand: Min guard Stand pivot transfers: Min guard       General transfer comment: slow unsteady movement with frequent leaning on nearby objects for support when not using AD, required use of RW for safety  Ambulation/Gait Ambulation/Gait assistance: Min guard Gait Distance (Feet): 35 Feet Assistive device: Rolling walker (2  wheeled);None Gait Pattern/deviations: Decreased step length - right;Decreased step length - left;Decreased stance time - left;Decreased stride length Gait velocity: decreased   General Gait Details: slow labored cadence with frequent leaning on nearby objects for support, required use of RW for safety with slightly labord cadence and no loss of balance  Stairs             Wheelchair Mobility    Modified Rankin (Stroke Patients Only)       Balance Overall balance assessment: Needs assistance Sitting-balance support: No upper extremity supported;Feet supported Sitting balance-Leahy Scale: Good Sitting balance - Comments: seated at side of gurney and in chair   Standing balance support: No upper extremity supported;During functional activity Standing balance-Leahy Scale: Poor Standing balance comment: fair/poor with frequent leaning on nearby objects for support                             Pertinent Vitals/Pain Pain Assessment: No/denies pain    Home Living Family/patient expects to be discharged to:: Private residence Living Arrangements: Parent Available Help at Discharge: Family;Available PRN/intermittently Type of Home: Apartment Home Access: Level entry     Home Layout: One level Home Equipment: None      Prior Function Level of Independence: Independent;Independent with assistive device(s)         Comments: Hydrographic surveyor, drives     Hand Dominance   Dominant Hand: Right    Extremity/Trunk Assessment   Upper Extremity Assessment Upper Extremity Assessment: Defer to OT evaluation    Lower Extremity Assessment Lower Extremity Assessment: Generalized weakness;RLE deficits/detail;LLE deficits/detail RLE Deficits / Details: grossly 5/5 RLE Sensation: WNL RLE Coordination: WNL LLE Deficits / Details: grossly 4/5, except ankle dorsiflexion grossly 3+/5 LLE Sensation: decreased light touch LLE Coordination: WNL    Cervical / Trunk Assessment Cervical / Trunk Assessment: Normal  Communication   Communication: No difficulties(slightly slurred speech after talking for a few minutes)  Cognition Arousal/Alertness: Awake/alert Behavior During Therapy: WFL for tasks assessed/performed Overall Cognitive Status: Within Functional Limits for tasks assessed                                         General Comments      Exercises     Assessment/Plan    PT Assessment Patient needs continued PT services  PT Problem List Decreased strength;Decreased activity tolerance;Decreased balance;Decreased mobility       PT Treatment Interventions DME instruction;Gait training;Stair training;Functional mobility training;Therapeutic activities;Therapeutic exercise;Balance training;Neuromuscular re-education;Patient/family education    PT Goals (Current goals can be found in the Care Plan section)  Acute Rehab PT Goals Patient Stated Goal: return home with family to assist PT Goal Formulation: With patient Time For Goal Achievement: 10/23/18    Frequency 7X/week   Barriers to discharge        Co-evaluation               AM-PAC PT "6 Clicks" Mobility  Outcome Measure Help needed turning from your back to your side while in a flat bed without using bedrails?: None Help needed moving from lying on your back to sitting on the side of a flat bed without using bedrails?: None Help needed moving to and from a bed to a chair (including a wheelchair)?: A Little Help needed standing up from a chair using your arms (e.g., wheelchair or bedside chair)?:  A Little Help needed to walk in hospital room?: A Little Help needed climbing 3-5 steps with a railing? : A Lot 6 Click Score: 19    End of Session   Activity Tolerance: Patient tolerated treatment well;Patient limited by fatigue Patient left: in bed;with call bell/phone within reach Nurse Communication: Mobility status PT Visit Diagnosis: Unsteadiness on feet (R26.81);Other abnormalities of gait and mobility (R26.89);Muscle weakness (generalized) (M62.81)    Time: [id number] PT Time Calculation (min) (ACUTE ONLY): 20 min   Charges:   PT Evaluation $PT Eval Moderate Complexity: 1 Mod PT Treatments $Therapeutic Activity: 8-22 mins        2:12 PM, 10/20/18 Ocie BobJames Jonee Lamore, MPT Physical Therapist with  [facility] 336 [phone] office [phone] mobile phone

## 2018-10-20 NOTE — ED Notes (Signed)
Family at bedside. 

## 2018-10-20 NOTE — Progress Notes (Signed)
PROGRESS NOTE  NIL BOLSER GUR:[id number] DOB: 07-25-1985 DOA: 10/19/2018 PCP: Patient, No Pcp Per  Brief History   33 year old woman no significant PMH reported other than migraines presented with left-sided weakness arm and leg as well as dysarthria.  She was seen by tele-neurology but at that time felt to have minimal symptoms and thus no TPA was recommended.  MRI was recommended and she was admitted for further evaluation.  A & P  Left upper and left lower extremity weakness, possible left facial weakness, dysarthria --Unclear etiology, might be related to complex migraine.  MRI negative for acute infarct.  Bilateral carotid ultrasound and echocardiogram unremarkable.  LDL unremarkable.  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  However given headache other considerations might include pseudotumor cerebri or intracranial hypertension. --Significance of the above is unclear as this is reported to be stable since CT 2015. --Neurology consultation has been placed, will follow up recommendations --She is allergic to aspirin which causes hives.  Given no evidence of stroke at this time, will defer further treatment to neurology.  Anxiety, stress.  Patient does endorse significant social stressors. --Recommend outpatient follow-up, establishment with PCP and consideration for treatment of anxiety  Mild lower abdominal pain tenderness patient has no urinary symptoms other than some frequency.  Urinalysis was equivocal.  Follow-up culture data.  Polysubstance use?Marland Kitchen  Patient does endorse marijuana use but denies cocaine use.   Continue current monitoring.  Await physical, occupational and speech therapy evaluations as well as neurology evaluation recommendations.  Difficult to ascertain the degree of deficit at this point.  Anticipate discharge once cleared by neurology  Consult CM to assist with PCP establishment   DVT prophylaxis: heparin Code Status: Full Family  Communication: none, patient awake, alert, did not request Disposition Plan: Home    Murray Hodgkins, MD  [facility] Direct contact: see [email or web address] (further directions at bottom of note if needed) 7PM-7AM contact night coverage as at bottom of note 10/20/2018, 12:14 PM  LOS: 0 days   Consultants  . Neurology   Procedures  . Echo IMPRESSIONS    1. The left ventricle has normal systolic function with an ejection fraction of 60-65%. The cavity size was normal. Left ventricular diastolic parameters were normal.  2. The right ventricle has normal systolic function. The cavity was normal. There is no increase in right ventricular wall thickness.  3. No evidence of mitral valve stenosis.  4. The aortic valve is tricuspid. No stenosis of the aortic valve.  5. The aorta is normal in size and structure.  6. The aortic root is normal in size and structure.  7. Pulmonary hypertension is indeterminate, inadequate TR jet.  8. The inferior vena cava was dilated in size with >50% respiratory variability.  Antibiotics  .   Interval History/Subjective  Remains weak on the left side, still has difficulty speaking.  Takes a lot of effort to move her left side.  Denies any previous history of this.  Does have migraines frequently several times a week usually takes Aleve.  She denies cocaine use but does admit to marijuana use.  She endorses significant amount of stress at home.  Objective   Vitals:  Vitals:   10/20/18 1115 10/20/18 1130  BP: 104/70 94/74  Pulse: 75 67  Resp: 18 18  Temp: 98.4 F (36.9 C)   SpO2: 97% 99%    Exam:  Constitutional:  . Appears calm and comfortable Eyes:  . pupils and irises appear normal . Normal  lids  ENMT:  . grossly normal hearing  . Lips appear normal Respiratory:  . CTA bilaterally, no w/r/r.  . Respiratory effort normal. Cardiovascular:  . RRR, no m/r/g . No LE extremity edema   Abdomen:  . Soft, nondistended, mild lower  generalized tenderness Musculoskeletal:  . Right upper extremity, right lower extremity strength and tone is grossly normal . Left upper and left lower extremity tone appears grossly normal, strength is variable in the upper and lower extremity and seems to vary depending on effort.  When moving these extremities the patient begins breathing hard but with light assistance she is able to provide more strength and movement then by herself.  It is difficult to ascertain the degree of her weakness. Neurologic:  . Cranial nerves appear intact with the exceptions of the tongue deviates to the right, there might be some left lower facial weakness with smile although this is somewhat inconsistent.  Her speech is dysarthric and somewhat difficult to understand and slow. Psychiatric:  . Mental status o Mood, affect appropriate  I have personally reviewed the following:   Today's Data  . LDL 58 . Urine drug screen positive for cocaine and marijuana  Lab Data  CMP and CBC were unremarkable on admission except for leukocytosis of 13.7 SARS-CoV-2 was negative Alcohol level is negative Micro Data  . Urine culture pending . Urinalysis was equivocal   Imaging  . Chest x-ray no acute disease . Bilateral carotid ultrasound normal . MRI brain noted  Cardiology Data  . ED echocardiogram LVEF 60-65%.  Normal RV systolic function.  No valvular abnormalities noted. . EKG showed sinus rhythm  Other Data  .   Scheduled Meds: .  stroke: mapping our early stages of recovery book   Does not apply Once  . heparin  5,000 Units Subcutaneous Q8H   Continuous Infusions: . sodium chloride      Principal Problem:   Left-sided weakness Active Problems:   Dysarthria   Marijuana abuse   LOS: 0 days   How to contact the [facility] Attending or Consulting provider 7A - 7P or covering provider during after hours 7P -7A, for this patient?  1. Check the care team in [facility] and look for a) attending/consulting [facility]  provider listed and b) the [facility] team listed 2. Log into [email or web address] and use [place]'s universal password to access. If you do not have the password, please contact the hospital operator. 3. Locate the [facility] provider you are looking for under [facility] and page to a number that you can be directly reached. 4. If you still have difficulty reaching the provider, please page the [facility] (Director on Call) for the Hospitalists listed on amion for assistance.   Prolonged services approximately 11 30-12 10, discussion with patient, examination of patient, review of chart and updated plan as well as coordination of care with neurology.

## 2018-10-20 NOTE — Progress Notes (Signed)
*  PRELIMINARY RESULTS* Echocardiogram 2D Echocardiogram has been performed.  Anna Gregory 10/20/2018, 10:26 AM

## 2018-10-20 NOTE — ED Notes (Signed)
Patient transported to MRI 

## 2018-10-20 NOTE — Consult Note (Signed)
[facility] Anna Gregory A. Gerilyn Pilgrim, MD     [email or web address]          Anna Gregory is an 33 y.o. female.   ASSESSMENT/PLAN: 1.  The patient symptomatology seems most consistent with migraine with aura/complicated migraines.  Ischemia seems less likely.  There is an outside chance that she could be having partial seizures but this is unlikely.  I will start the patient on a cocktail of Reglan and Toradol for the next 12 to 24 hours.  Topamax will also be initiated.  Some of the patient's examination findings suggest psychosomatic overlay.  The patient will likely need physical and occupational therapy along with speech therapy.  The patient is a 33 year old black female who presents with history of episodic headaches.  She tells me she does have history of migraine headaches which occur quite frequently about a few times a week.  The headaches are periorbital and usually on one side.  They are associated with blurry vision, photophobia, phonophobia and nausea although no emesis reported.  She reports having blurry of her vision with other visual symptoms sometimes with these headaches.  She was at work when she developed another severe attack of headaches.  This headache was on the left side above the left periorbital region and like her typical headaches associated with nausea, photophobia and phonophobia.  She reports however that she did not feel right and developed numbness and heaviness of the left upper extremity and difficulty speaking.  She decided to seek medical attention especially after being encouraged by 1 of her coworkers.  She does not report having palpitations or chest pain.  She does report having some mild dyspnea which she thinks is related to the fact that she is having difficulties moving the left side.  She does not report having any chronic medical illnesses.  She denies any history of hypertension or diabetes.  Toxicology results are positive for marijuana cocaine.   She has been honest during this evaluation stating that she uses marijuana frequently.  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  She is around it however with other people using.  The review of systems otherwise negative.    GENERAL: This is a pleasant female who appears to be in significant discomfort from headaches but no acute distress.  HEENT: The neck is supple no trauma appreciated.  ABDOMEN: soft  EXTREMITIES: No edema   BACK:  Normal  SKIN: Normal by inspection.    MENTAL STATUS: Alert and oriented. Speech -moderately dysarthric, language and cognition are generally intact. Judgment and insight normal.   CRANIAL NERVES: Pupils are equal, round and reactive to light and accomodation; extra ocular movements are full, there is no significant nystagmus; visual fields are full; upper and lower facial muscles are normal in strength and symmetric, there appears to be mild facial asymmetry with some flattening of the nasolabial fold on the right.  She deviates the tongue to the right although this seems more psychosomatic.   MOTOR: There is mild drift of the left upper extremity and left leg.  There is slightly increased tone involving the left upper extremity left leg.  Left upper extremity strength is actually normal 5/5 on deltoid and triceps and handgrip.  Left hip flexion is 4+ and dorsiflexion 2/5.  The right side shows normal tone, bulk and strength.  There is no drift of the right upper  and lower extremities.  COORDINATION: Left finger to nose is normal, right finger to nose is normal, No rest tremor; no intention tremor; no postural tremor; no bradykinesia.  REFLEXES: Deep tendon reflexes are symmetrical and normal. Plantar reflexes are flexor bilaterally.   SENSATION: Reduced sensation to temperature light touch involving the left upper extremity and left leg.          Blood pressure 107/75, pulse 78, temperature 98.8 F (37.1 C), temperature source Oral, resp. rate 18, height 5\' 8"  (1.727 m), weight 88.5 kg, last menstrual period 10/19/2018, SpO2 100 %.  Past Medical History:  Diagnosis Date   Chlamydia    Depression    PONV (postoperative nausea and vomiting)    Trichomonas    Urinary tract infection     Past Surgical History:  Procedure Laterality Date   CESAREAN SECTION     TUBAL LIGATION  01/10/2011   Procedure: POST PARTUM TUBAL LIGATION;  Surgeon: Mora Bellman, MD;  Location: [facility];  Service: Gynecology;  Laterality: Bilateral;  Bilateral post partum tubal ligation.    Family History  Problem Relation Age of Onset   Lung cancer Mother    Hypertension Maternal Grandmother    Diabetes Paternal Grandfather    Aneurysm Paternal Grandmother     Social History:  reports that she has been smoking. She has smoked for the past 3.00 years. She has never used smokeless tobacco. She reports current alcohol use. She reports current drug use. Drug: Marijuana.  Allergies:  Allergies  Allergen Reactions   Strawberry Extract Anaphylaxis and Other (See Comments)    Reaction: throat swelling    Dilaudid [Hydromorphone Hcl] Itching    C/o of itching after receiving 0.5mg  of IV dilaudid today .    Aspirin Hives    Pt can take ibuprofen    Medications: Prior to Admission medications   Medication Sig Start Date End Date Taking? Authorizing Provider  ibuprofen (ADVIL) 200 MG tablet Take 200 mg by mouth every 6 (six) hours as needed for mild pain or moderate pain.   Yes [provider]  prochlorperazine (COMPAZINE) 10 MG tablet Take 1 tablet (10 mg total) by mouth 2 (two) times daily as needed (headache). 08/15/18  Yes Maudie Flakes, MD    Scheduled Meds:   stroke: mapping our early stages of recovery book   Does not apply Once   heparin  5,000 Units Subcutaneous Q8H   Continuous Infusions:  sodium chloride      PRN Meds:.acetaminophen **OR** acetaminophen (TYLENOL) oral liquid 160 mg/5 mL **OR** acetaminophen, hydrOXYzine, ibuprofen, senna-docusate     Results for orders placed or performed during the hospital encounter of 10/19/18 (from the past 48 hour(s))  Ethanol     Status: None   Collection Time: 10/19/18  9:26 PM  Result Value Ref Range   Alcohol, Ethyl (B) <10 <10 mg/dL    Comment: (NOTE) Lowest detectable limit for serum alcohol is 10 mg/dL. For medical purposes only. Performed at [facility], [address] [place], [place] [address]   Protime-INR     Status: None   Collection Time: 10/19/18  9:26 PM  Result Value Ref Range   Prothrombin Time 15.2 11.4 - 15.2 seconds   INR 1.2 0.8 - 1.2    Comment: (NOTE) INR goal varies based on device and disease states. Performed at [facility], [address], [place], [place] [address]   APTT     Status: None   Collection Time: 10/19/18  9:26  PM  Result Value Ref Range   aPTT 29 24 - 36 seconds    Comment: Performed at [facility], [address], [place], [place] [address]  CBC     Status: Abnormal   Collection Time: 10/19/18  9:26 PM  Result Value Ref Range   WBC 13.7 (H) 4.0 - 10.5 K/uL   RBC 4.03 3.87 - 5.11 MIL/uL   Hemoglobin 11.6 (L) 12.0 - 15.0 g/dL   HCT 60.4 54.0 - 98.1 %   MCV 91.1 80.0 - 100.0 fL   MCH 28.8 26.0 - 34.0 pg   MCHC 31.6 30.0 - 36.0 g/dL   RDW 19.1 47.8 - 29.5 %   Platelets 270 150 - 400 K/uL   nRBC 0.0 0.0 - 0.2 %    Comment: Performed at [facility], [address] [place], [place] [address]  Differential     Status: Abnormal   Collection Time: 10/19/18  9:26 PM  Result Value Ref Range   Neutrophils Relative % 61 %   Neutro Abs 8.4 (H) 1.7 - 7.7 K/uL   Lymphocytes Relative 25 %   Lymphs Abs 3.3 0.7 - 4.0 K/uL   Monocytes Relative 6 %   Monocytes Absolute 0.8 0.1 - 1.0 K/uL   Eosinophils Relative 7 %   Eosinophils Absolute 1.0 (H) 0.0 - 0.5 K/uL   Basophils Relative 1 %   Basophils  Absolute 0.1 0.0 - 0.1 K/uL   Immature Granulocytes 0 %   Abs Immature Granulocytes 0.03 0.00 - 0.07 K/uL    Comment: Performed at [facility], [address] [place], [place] [address]  Comprehensive metabolic panel     Status: Abnormal   Collection Time: 10/19/18  9:26 PM  Result Value Ref Range   Sodium 137 135 - 145 mmol/L   Potassium 3.4 (L) 3.5 - 5.1 mmol/L   Chloride 105 98 - 111 mmol/L   CO2 23 22 - 32 mmol/L   Glucose, Bld 97 70 - 99 mg/dL   BUN 7 6 - 20 mg/dL   Creatinine, Ser 4.69 0.44 - 1.00 mg/dL   Calcium 9.2 8.9 - 62.9 mg/dL   Total Protein 7.9 6.5 - 8.1 g/dL   Albumin 4.7 3.5 - 5.0 g/dL   AST 15 15 - 41 U/L   ALT 15 0 - 44 U/L   Alkaline Phosphatase 43 38 - 126 U/L   Total Bilirubin 0.8 0.3 - 1.2 mg/dL   GFR calc non Af Amer >60 >60 mL/min   GFR calc Af Amer >60 >60 mL/min   Anion gap 9 5 - 15    Comment: Performed at [facility], [address] [place], [place] [address]  I-Stat beta hCG blood, ED ([facility], [facility], [facility] only)     Status: None   Collection Time: 10/19/18  9:26 PM  Result Value Ref Range   I-stat hCG, quantitative <5.0 <5 mIU/mL   Comment 3            Comment:   GEST. AGE      CONC.  (mIU/mL)   <=1 WEEK        5 - 50     2 WEEKS       50 - 500     3 WEEKS       100 - 10,000     4 WEEKS     1,000 - 30,000        FEMALE AND NON-PREGNANT FEMALE:     LESS THAN  5 mIU/mL   Hemoglobin A1c     Status: None   Collection Time: 10/19/18  9:26 PM  Result Value Ref Range   Hgb A1c MFr Bld 5.2 4.8 - 5.6 %    Comment: (NOTE) Pre diabetes:          5.7%-6.4% Diabetes:              >6.4% Glycemic control for   <7.0% adults with diabetes    Mean Plasma Glucose 102.54 mg/dL    Comment: Performed at [facility], [address] [place], [place] [address]  I-stat chem 8, ED     Status: Abnormal   Collection Time: 10/19/18  9:28 PM  Result Value Ref Range   Sodium 140 135 - 145 mmol/L   Potassium 3.5 3.5 - 5.1 mmol/L   Chloride 105 98 - 111 mmol/L   BUN  5 (L) 6 - 20 mg/dL   Creatinine, Ser 7.820.70 0.44 - 1.00 mg/dL   Glucose, Bld 93 70 - 99 mg/dL   Calcium, Ion 9.561.14 (L) 1.15 - 1.40 mmol/L   TCO2 24 22 - 32 mmol/L   Hemoglobin 12.9 12.0 - 15.0 g/dL   HCT 21.338.0 08.636.0 - 57.846.0 %  CBG monitoring, ED     Status: None   Collection Time: 10/19/18  9:34 PM  Result Value Ref Range   Glucose-Capillary 90 70 - 99 mg/dL  SARS Coronavirus 2 (CEPHEID - Performed in [facility] hospital lab), Hosp Order     Status: None   Collection Time: 10/19/18 10:05 PM   Specimen: Nasopharyngeal Swab  Result Value Ref Range   SARS Coronavirus 2 NEGATIVE NEGATIVE    Comment: (NOTE) If result is NEGATIVE SARS-CoV-2 target nucleic acids are NOT DETECTED. The SARS-CoV-2 RNA is generally detectable in upper and lower  respiratory specimens during the acute phase of infection. The lowest  concentration of SARS-CoV-2 viral copies this assay can detect is 250  copies / mL. A negative result does not preclude SARS-CoV-2 infection  and should not be used as the sole basis for treatment or other  patient management decisions.  A negative result may occur with  improper specimen collection / handling, submission of specimen other  than nasopharyngeal swab, presence of viral mutation(s) within the  areas targeted by this assay, and inadequate number of viral copies  (<250 copies / mL). A negative result must be combined with clinical  observations, patient history, and epidemiological information. If result is POSITIVE SARS-CoV-2 target nucleic acids are DETECTED. The SARS-CoV-2 RNA is generally detectable in upper and lower  respiratory specimens dur ing the acute phase of infection.  Positive  results are indicative of active infection with SARS-CoV-2.  Clinical  correlation with patient history and other diagnostic information is  necessary to determine patient infection status.  Positive results do  not rule out bacterial infection or co-infection with other viruses. If  result is PRESUMPTIVE POSTIVE SARS-CoV-2 nucleic acids MAY BE PRESENT.   A presumptive positive result was obtained on the submitted specimen  and confirmed on repeat testing.  While 2019 novel coronavirus  (SARS-CoV-2) nucleic acids may be present in the submitted sample  additional confirmatory testing may be necessary for epidemiological  and / or clinical management purposes  to differentiate between  SARS-CoV-2 and other Sarbecovirus currently known to infect humans.  If clinically indicated additional testing with an alternate test  methodology [phone]([id number]) is advised. The SARS-CoV-2 RNA is generally  detectable in upper  and lower respiratory sp ecimens during the acute  phase of infection. The expected result is Negative. Fact Sheet for Patients:  BoilerBrush.com.cyhttps://www.fda.gov/media/136312/download Fact Sheet for Healthcare Providers: https://pope.com/https://www.fda.gov/media/136313/download This test is not yet approved or cleared by the [place] FDA and has been authorized for detection and/or diagnosis of SARS-CoV-2 by FDA under an Emergency Use Authorization (EUA).  This EUA will remain in effect (meaning this test can be used) for the duration of the COVID-19 declaration under Section 564(b)(1) of the Act, 21 U.S.C. section 360bbb-3(b)(1), unless the authorization is terminated or revoked sooner. Performed at [facility], [address] [place], [place] [address]   Urine rapid drug screen (hosp performed)     Status: Abnormal   Collection Time: 10/19/18 11:48 PM  Result Value Ref Range   Opiates NONE DETECTED NONE DETECTED   Cocaine POSITIVE (A) NONE DETECTED   Benzodiazepines NONE DETECTED NONE DETECTED   Amphetamines NONE DETECTED NONE DETECTED   Tetrahydrocannabinol POSITIVE (A) NONE DETECTED   Barbiturates NONE DETECTED NONE DETECTED    Comment: (NOTE) DRUG SCREEN FOR MEDICAL PURPOSES ONLY.  IF CONFIRMATION IS NEEDED FOR ANY PURPOSE, NOTIFY LAB WITHIN 5 DAYS. LOWEST DETECTABLE  LIMITS FOR URINE DRUG SCREEN Drug Class                     Cutoff (ng/mL) Amphetamine and metabolites    1000 Barbiturate and metabolites    200 Benzodiazepine                 200 Tricyclics and metabolites     300 Opiates and metabolites        300 Cocaine and metabolites        300 THC                            50 Performed at [facility], [address] [place], [place] [address]   Urinalysis, Routine w reflex microscopic     Status: Abnormal   Collection Time: 10/19/18 11:48 PM  Result Value Ref Range   Color, Urine AMBER (A) YELLOW    Comment: BIOCHEMICALS MAY BE AFFECTED BY COLOR   APPearance HAZY (A) CLEAR   Specific Gravity, Urine 1.030 1.005 - 1.030   pH 6.0 5.0 - 8.0   Glucose, UA NEGATIVE NEGATIVE mg/dL   Hgb urine dipstick LARGE (A) NEGATIVE   Bilirubin Urine NEGATIVE NEGATIVE   Ketones, ur 20 (A) NEGATIVE mg/dL   Protein, ur 30 (A) NEGATIVE mg/dL   Nitrite POSITIVE (A) NEGATIVE   Leukocytes,Ua NEGATIVE NEGATIVE   RBC / HPF 0-5 0 - 5 RBC/hpf   WBC, UA 0-5 0 - 5 WBC/hpf   Bacteria, UA MANY (A) NONE SEEN   Squamous Epithelial / LPF 0-5 0 - 5   Mucus PRESENT     Comment: Performed at [facility], [address] [place], [place] [address]  POC urine preg, ED     Status: None   Collection Time: 10/19/18 11:51 PM  Result Value Ref Range   Preg Test, Ur NEGATIVE NEGATIVE    Comment:        THE SENSITIVITY OF THIS METHODOLOGY IS >24 mIU/mL   Lipid panel     Status: None   Collection Time: 10/20/18  9:23 AM  Result Value Ref Range   Cholesterol 117 0 - 200 mg/dL   Triglycerides 33 <914<150 mg/dL   HDL 52 >78>40 mg/dL   Total CHOL/HDL  Ratio 2.3 RATIO   VLDL 7 0 - 40 mg/dL   LDL Cholesterol 58 0 - 99 mg/dL    Comment:        Total Cholesterol/HDL:CHD Risk Coronary Heart Disease Risk Table                     Men   Women  1/2 Average Risk   3.4   3.3  Average Risk       5.0   4.4  2 X Average Risk   9.6   7.1  3 X Average Risk  23.4   11.0        Use the  calculated Patient Ratio above and the CHD Risk Table to determine the patient's CHD Risk.        ATP III CLASSIFICATION (LDL):  <100     mg/dL   Optimal  [id number]  mg/dL   Near or Above                    Optimal  130-159  mg/dL   Borderline  [id number]  mg/dL   High  >811>190     mg/dL   Very High Performed at [facility], [address] [place], [place] [address]     Studies/Results:   BRAIN MRI FINDINGS: Brain: No restricted diffusion to suggest acute infarction. No midline shift, mass effect, evidence of mass lesion, ventriculomegaly, extra-axial collection or acute intracranial hemorrhage. Pituitary within normal limits.  Wallace CullensGray and white matter signal is within normal limits throughout the brain. No encephalomalacia or chronic blood products identified.  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 The other basilar cisterns appear normal.  Vascular: Major intracranial vascular flow voids are preserved.  Skull and upper cervical spine: Negative visible cervical spine. Grossly normal bone marrow signal.  Sinuses/Orbits: Negative orbits. Mild paranasal sinus mucosal thickening.  Other: Mild adenoid hypertrophy which appears physiologic. Mastoids are clear. Grossly normal visible internal auditory structures. Scalp and face soft tissues appear negative.  IMPRESSION: 1. Negative for acute infarct. Normal noncontrast MRI appearance of the brain aside from mild cerebellar tonsillar ectopia.     Carotid duplex Doppler normal    The brain MRI is reviewed in person.  There are a few tiny increase signal involving the posterior frontal/parietal area on DWI.  These involve the cortical areas and may be artifact well.  No other abnormal findings are noted.  No hemorrhage or abnormality seen on FLAIR imaging.  Blood vessels are normal.  The cerebellum is low-lying without clear evidence of malformation.   TTE 1. The  left ventricle has normal systolic function with an ejection fraction of 60-65%. The cavity size was normal. Left ventricular diastolic parameters were normal.  2. The right ventricle has normal systolic function. The cavity was normal. There is no increase in right ventricular wall thickness.  3. No evidence of mitral valve stenosis.  4. The aortic valve is tricuspid. No stenosis of the aortic valve.  5. The aorta is normal in size and structure.  6. The aortic root is normal in size and structure.  7. Pulmonary hypertension is indeterminate, inadequate TR jet.  8. The inferior vena cava was dilated in size with >50% respiratory variability    Genaro Bekker A. Gerilyn Pilgrimoonquah, M.D.  Diplomate, Biomedical engineerAmerican Board of Psychiatry and Neurology ( Neurology). 10/20/2018, 5:32 PM

## 2018-10-20 NOTE — Progress Notes (Signed)
CODE STROKE  Beeper time  2115 Call time  2115 Exam start  2123 Exam end  2116 Soc   2126 Epic   2126 Rad called  [phone]

## 2018-10-21 DIAGNOSIS — R531 Weakness: Secondary | ICD-10-CM

## 2018-10-21 DIAGNOSIS — F1410 Cocaine abuse, uncomplicated: Secondary | ICD-10-CM

## 2018-10-21 DIAGNOSIS — F1210 Cannabis abuse, uncomplicated: Secondary | ICD-10-CM

## 2018-10-21 DIAGNOSIS — R471 Dysarthria and anarthria: Secondary | ICD-10-CM | POA: Diagnosis not present

## 2018-10-21 LAB — HIV ANTIBODY (ROUTINE TESTING W REFLEX): HIV Screen 4th Generation wRfx: NONREACTIVE

## 2018-10-21 LAB — URINE CULTURE

## 2018-10-21 MED ORDER — PHENAZOPYRIDINE HCL 100 MG PO TABS: 200.0000 mg | ORAL_TABLET | Freq: Three times a day (TID) | ORAL | Status: DC | PRN

## 2018-10-21 MED ORDER — NITROFURANTOIN MONOHYD MACRO 100 MG PO CAPS: 100.0000 mg | ORAL_CAPSULE | Freq: Two times a day (BID) | ORAL | 0 refills | Status: AC

## 2018-10-21 MED ORDER — TOPIRAMATE 25 MG PO TABS: 25.0000 mg | ORAL_TABLET | Freq: Two times a day (BID) | ORAL | 1 refills | Status: AC

## 2018-10-21 MED ORDER — GABAPENTIN 300 MG PO CAPS
300.0000 mg | ORAL_CAPSULE | Freq: Once | ORAL | Status: AC
  Administered 2018-10-21: 300 mg via ORAL
  Filled 2018-10-21: qty 1

## 2018-10-21 MED ORDER — NITROFURANTOIN MONOHYD MACRO 100 MG PO CAPS
100.0000 mg | ORAL_CAPSULE | Freq: Two times a day (BID) | ORAL | Status: DC
  Administered 2018-10-21 (×2): 100 mg via ORAL
  Filled 2018-10-21 (×2): qty 1

## 2018-10-21 NOTE — Progress Notes (Signed)
IV removed, patient tolerated well.  Reviewed AVS with patient who verbalized understanding.  Patient taken to short stay lobby via wheelchair and transported home by her mother.

## 2018-10-21 NOTE — Progress Notes (Signed)
Patient reports burning sensation in left flank that is worse with palpation. Her UA yesterday showed many bacteria, but only 0-5 WBC. Since she is symptomatic, it is likely not contamination. Urine culture in process. Will start empiric treatment for UTI.

## 2018-10-21 NOTE — Discharge Summary (Signed)
Physician Discharge Summary  Anna Gregory ZOX:[id number] DOB: [birth year] DOA: 10/19/2018  PCP: Patient, No Pcp Per  Admit date: 10/19/2018 Discharge date: 10/21/2018  Admitted From: Home Disposition:  Home  Recommendations for Outpatient Follow-up:  1. Follow up with PCP in 1-2 weeks 2. Please obtain BMP/CBC in one week   Home Health:no Equipment/Devices:rolling walkder  Discharge Condition: Stable CODE STATUS: FULL Diet recommendation: Heart Healthy    Brief/Interim Summary: 33 year old female with a history of depression/anxiety/THC use presenting with migraine headaches with associated left hemiparesis. The headaches are periorbital and usually on one side.  They are associated with blurry vision, photophobia, phonophobia and nausea although no emesis reported.  She reports having blurry of her vision with other visual symptoms sometimes with these headaches.  She was at work when she developed another severe attack of headaches.  Apparently, the patient was in her car when she developed what she felt to be a panic attack.  She try to get out of her car, but had difficulty doing so because of development of left-sided weakness and numbness.  She stated that the symptoms last about 2 hours.  EMS was activated.  The patient was brought to emergency department and a full stroke work-up was undertaken. When she presented to the ED code stroke was called and the tele-neurologist was consulted.  Tele-neurologist gave her an NIH scale score of 1.  Apparently left-sided weakness significantly improved during tele-neurologist evaluation. Neurologist reviewed the CT scan, which showed no acute intracranial infarct or other abnormality identified.  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  Neurologist does not recommend a CTA, or TPA.  MRI was obtained and was negative for any acute infarct but showed mild cerebellar tonsillar ectopia that was nonspecific and appeared to be chronic.  Neurology was  consulted and felt that the patient's symptomatology was most likely attributable to complicated migraine with aura.  The patient was started on Reglan and Topamax with improvement of her headaches and her left hemiparesis and dysesthesias.  She was evaluated by physical therapy who recommended outpatient PT.  Urine drug screen showed cocaine and THC.  On the day of discharge, the patient stated that her dysesthesias are completely resolved, and her left-sided hemiparesis was 90% improved.  In fact, the patient was able to ambulate to the bathroom without assistance and get out of bed without any assistance.  Discharge Diagnoses:  Dysphasia/Left hemiparesis/complicated migraine --Appreciate Neurology Consult--most likely complicated migrane -started topamax and reglan-->d/c home with topamax -PT/OT evaluation-->oupt PT -Speech therapy eval -CT brain--neg -MRI brain--no acute findings.  Chronic appearing mild cerebellar tonsillar ectopia -Carotid Duplex--negative -Echo--EF 60-65%, no PFO, mild TR -LDL--58 -HbA1C--5.2 -Antiplatelet--N/A -Patient also had some nonphysiologic aspects of her physical exam  Anxiety/depression -Patient endorses significant social stressors -Recommend outpatient follow-up with PCP and consideration for treatment of anxiety  Mild lower abdominal tenderness -Abdominal exam is benign -Patient is tolerating diet -Urinalysis negative for pyuria -Apparently, patient had complained of intermittent dysuria during hospitalization -Discharge home with 3 days of nitrofurantoin  Polysubstance abuse -THC and cocaine noted on urine drug screen   Discharge Instructions  Discharge Instructions    Ambulatory referral to Physical Therapy   Complete by: As directed      Allergies as of 10/21/2018      Reactions   Strawberry Extract Anaphylaxis, Other (See Comments)   Reaction: throat swelling   Dilaudid [hydromorphone Hcl] Itching   C/o of itching after receiving  0.5mg  of IV dilaudid today .  Aspirin Hives   Pt can take ibuprofen      Medication List    TAKE these medications   ibuprofen 200 MG tablet Commonly known as: ADVIL Take 200 mg by mouth every 6 (six) hours as needed for mild pain or moderate pain.   nitrofurantoin (macrocrystal-monohydrate) 100 MG capsule Commonly known as: MACROBID Take 1 capsule (100 mg total) by mouth every 12 (twelve) hours.   prochlorperazine 10 MG tablet Commonly known as: COMPAZINE Take 1 tablet (10 mg total) by mouth 2 (two) times daily as needed (headache).   topiramate 25 MG tablet Commonly known as: TOPAMAX Take 1 tablet (25 mg total) by mouth 2 (two) times daily.            Durable Medical Equipment  (From admission, onward)         Start     Ordered   10/20/18 1222  For home use only DME Walker rolling  Once    Question:  Patient needs a walker to treat with the following condition  Answer:  Acute left-sided weakness   10/20/18 1221         Follow-up Information    Rosita Fire, MD Follow up.   Specialty: Internal Medicine Why: They will call you to set up an appointment after they have checked your insurance.  Call them if you do not hear from them 48 hours after discharge. Contact information: [place] [address] [phone]          Allergies  Allergen Reactions   Strawberry Extract Anaphylaxis and Other (See Comments)    Reaction: throat swelling    Dilaudid [Hydromorphone Hcl] Itching    C/o of itching after receiving 0.5mg  of IV dilaudid today .    Aspirin Hives    Pt can take ibuprofen    Consultations:  neurology   Procedures/Studies: Dg Chest 2 View  Result Date: 10/20/2018 CLINICAL DATA:  Left-sided weakness.  Dizziness. EXAM: CHEST - 2 VIEW COMPARISON:  06/06/2015. FINDINGS: Mediastinum and hilar structures normal. Heart size normal. No focal infiltrate. Tiny calcified pulmonary nodule right upper lung most likely  granuloma. No pleural effusion or pneumothorax. Mild thoracic spine scoliosis. IMPRESSION: No acute cardiopulmonary disease. Electronically Signed   By: Marcello Moores  Register   On: 10/20/2018 07:44   Mr Brain Wo Contrast  Result Date: 10/20/2018 CLINICAL DATA:  33 year old female with left side weakness, numbness and speech difficulty beginning 1900 hours yesterday. EXAM: MRI HEAD WITHOUT CONTRAST TECHNIQUE: Multiplanar, multiecho pulse sequences of the brain and surrounding structures were obtained without intravenous contrast. COMPARISON:  Head CT 10/19/2018, and earlier. FINDINGS: Brain: No restricted diffusion to suggest acute infarction. No midline shift, mass effect, evidence of mass lesion, ventriculomegaly, extra-axial collection or acute intracranial hemorrhage. Pituitary within normal limits. Pearline Cables and white matter signal is within normal limits throughout the brain. No encephalomalacia or chronic blood products identified. 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 The other basilar cisterns appear normal. Vascular: Major intracranial vascular flow voids are preserved. Skull and upper cervical spine: Negative visible cervical spine. Grossly normal bone marrow signal. Sinuses/Orbits: Negative orbits. Mild paranasal sinus mucosal thickening. Other: Mild adenoid hypertrophy which appears physiologic. Mastoids are clear. Grossly normal visible internal auditory structures. Scalp and face soft tissues appear negative. IMPRESSION: 1. Negative for acute infarct. Normal noncontrast MRI appearance of the brain aside from mild cerebellar tonsillar ectopia. This is nonspecific and appears to be chronic. In the  absence of headache this may be physiologic. Other differential considerations would include intracranial hyper- (pseudotumor cerebri)) and intracranial hypotension. 2. Mild paranasal sinus disease. Electronically Signed   By: Odessa FlemingH  Hall M.D.   On: 10/20/2018 07:33     [place] Carotid Bilateral (at [facility] And [facility] Only)  Result Date: 10/20/2018 CLINICAL DATA:  33 year old female with shortness of breath and stroke-like symptoms EXAM: BILATERAL CAROTID DUPLEX ULTRASOUND TECHNIQUE: Wallace CullensGray scale imaging, color Doppler and duplex ultrasound were performed of bilateral carotid and vertebral arteries in the neck. COMPARISON:  None. FINDINGS: Criteria: Quantification of carotid stenosis is based on velocity parameters that correlate the residual internal carotid diameter with NASCET-based stenosis levels, using the diameter of the distal internal carotid lumen as the denominator for stenosis measurement. The following velocity measurements were obtained: RIGHT ICA: 124/28 cm/sec CCA: 128/33 cm/sec SYSTOLIC ICA/CCA RATIO:  1.0 ECA:  98 cm/sec LEFT ICA: 120/36 cm/sec CCA: 143/35 cm/sec SYSTOLIC ICA/CCA RATIO:  0.8 ECA:  98 cm/sec RIGHT CAROTID ARTERY: No significant atherosclerotic plaque or evidence of stenosis RIGHT VERTEBRAL ARTERY:  Patent with normal antegrade flow. LEFT CAROTID ARTERY: No significant atherosclerotic plaque or evidence of stenosis. LEFT VERTEBRAL ARTERY:  Patent with normal antegrade flow. IMPRESSION: Normal bilateral carotid duplex ultrasound. Signed, Sterling BigHeath K. McCullough, MD, RPVI [facility] Electronically Signed   By: Malachy MoanHeath  McCullough M.D.   On: 10/20/2018 08:47   Ct Head Code Stroke Wo Contrast  Result Date: 10/19/2018 CLINICAL DATA:  Code stroke. Initial evaluation for acute left-sided weakness. EXAM: CT HEAD WITHOUT CONTRAST TECHNIQUE: Contiguous axial images were obtained from the base of the skull through the vertex without intravenous contrast. COMPARISON:  Prior CT from 06/23/2013. FINDINGS: Brain: Cerebral volume within normal limits for patient age. No evidence for acute intracranial hemorrhage. No findings to suggest acute large vessel territory infarct. No mass lesion, midline shift, or mass  effect. Ventricles are normal in size without evidence for hydrocephalus. No extra-axial fluid collection identified. Chiari 1 malformation noted. Vascular: No hyperdense vessel identified. Skull: Scalp soft tissues demonstrate no acute abnormality. Calvarium intact. Sinuses/Orbits: Globes and orbital soft tissues within normal limits. Visualized paranasal sinuses are clear. Chronic bilateral nasal bone fractures noted. No mastoid effusion. ASPECTS [facility]([place] Stroke Program Early CT Score) - Ganglionic level infarction (caudate, lentiform nuclei, internal capsule, insula, M1-M3 cortex): 7 - Supraganglionic infarction (M4-M6 cortex): 3 Total score (0-10 with 10 being normal): 10 IMPRESSION: 1. No acute intracranial infarct or other abnormality identified. 2. ASPECTS is 10. 3. Chiari 1 malformation. Critical Value/emergent results were called by telephone at the time of interpretation on 10/19/2018 at 9:41 pm to Dr. Vanetta MuldersSCOTT ZACKOWSKI , who verbally acknowledged these results. Electronically Signed   By: Rise MuBenjamin  McClintock M.D.   On: 10/19/2018 21:45        Discharge Exam: Vitals:   10/21/18 0400 10/21/18 0600  BP: 100/67 106/71  Pulse: 66 64  Resp: 18 18  Temp:  97.7 F (36.5 C)  SpO2: 100% 100%   Vitals:   10/21/18 0000 10/21/18 0200 10/21/18 0400 10/21/18 0600  BP: 115/83 109/60 100/67 106/71  Pulse: 70 (!) 55 66 64  Resp: 16 18 18 18   Temp:    97.7 F (36.5 C)  TempSrc:    Oral  SpO2: 100% 100% 100% 100%  Weight:      Height:        General: Pt is alert, awake, not in acute distress Cardiovascular: RRR, S1/S2 +, no rubs, no  gallops Respiratory: CTA bilaterally, no wheezing, no rhonchi Abdominal: Soft, NT, ND, bowel sounds + Extremities: no edema, no cyanosis Neuro:  CN II-XII intact, strength 4/5 in RUE, RLE, strength 4/5 LUE, LLE; sensation intact bilateral; no dysmetria; babinski equivocal +Hoover sign on Left leg   The results of significant diagnostics from this  hospitalization (including imaging, microbiology, ancillary and laboratory) are listed below for reference.    Significant Diagnostic Studies: Dg Chest 2 View  Result Date: 10/20/2018 CLINICAL DATA:  Left-sided weakness.  Dizziness. EXAM: CHEST - 2 VIEW COMPARISON:  06/06/2015. FINDINGS: Mediastinum and hilar structures normal. Heart size normal. No focal infiltrate. Tiny calcified pulmonary nodule right upper lung most likely granuloma. No pleural effusion or pneumothorax. Mild thoracic spine scoliosis. IMPRESSION: No acute cardiopulmonary disease. Electronically Signed   By: Maisie Fus  Register   On: 10/20/2018 07:44   Mr Brain Wo Contrast  Result Date: 10/20/2018 CLINICAL DATA:  33 year old female with left side weakness, numbness and speech difficulty beginning 1900 hours yesterday. EXAM: MRI HEAD WITHOUT CONTRAST TECHNIQUE: Multiplanar, multiecho pulse sequences of the brain and surrounding structures were obtained without intravenous contrast. COMPARISON:  Head CT 10/19/2018, and earlier. FINDINGS: Brain: No restricted diffusion to suggest acute infarction. No midline shift, mass effect, evidence of mass lesion, ventriculomegaly, extra-axial collection or acute intracranial hemorrhage. Pituitary within normal limits. Wallace Cullens and white matter signal is within normal limits throughout the brain. No encephalomalacia or chronic blood products identified. 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 The other basilar cisterns appear normal. Vascular: Major intracranial vascular flow voids are preserved. Skull and upper cervical spine: Negative visible cervical spine. Grossly normal bone marrow signal. Sinuses/Orbits: Negative orbits. Mild paranasal sinus mucosal thickening. Other: Mild adenoid hypertrophy which appears physiologic. Mastoids are clear. Grossly normal visible internal auditory structures. Scalp and face soft tissues appear negative. IMPRESSION: 1.  Negative for acute infarct. Normal noncontrast MRI appearance of the brain aside from mild cerebellar tonsillar ectopia. This is nonspecific and appears to be chronic. In the absence of headache this may be physiologic. Other differential considerations would include intracranial hyper- (pseudotumor cerebri)) and intracranial hypotension. 2. Mild paranasal sinus disease. Electronically Signed   By: Odessa Fleming M.D.   On: 10/20/2018 07:33   [place] Carotid Bilateral (at [facility] And Ap Only)  Result Date: 10/20/2018 CLINICAL DATA:  33 year old female with shortness of breath and stroke-like symptoms EXAM: BILATERAL CAROTID DUPLEX ULTRASOUND TECHNIQUE: Wallace Cullens scale imaging, color Doppler and duplex ultrasound were performed of bilateral carotid and vertebral arteries in the neck. COMPARISON:  None. FINDINGS: Criteria: Quantification of carotid stenosis is based on velocity parameters that correlate the residual internal carotid diameter with NASCET-based stenosis levels, using the diameter of the distal internal carotid lumen as the denominator for stenosis measurement. The following velocity measurements were obtained: RIGHT ICA: 124/28 cm/sec CCA: 128/33 cm/sec SYSTOLIC ICA/CCA RATIO:  1.0 ECA:  98 cm/sec LEFT ICA: 120/36 cm/sec CCA: 143/35 cm/sec SYSTOLIC ICA/CCA RATIO:  0.8 ECA:  98 cm/sec RIGHT CAROTID ARTERY: No significant atherosclerotic plaque or evidence of stenosis RIGHT VERTEBRAL ARTERY:  Patent with normal antegrade flow. LEFT CAROTID ARTERY: No significant atherosclerotic plaque or evidence of stenosis. LEFT VERTEBRAL ARTERY:  Patent with normal antegrade flow. IMPRESSION: Normal bilateral carotid duplex ultrasound. Signed, Sterling Big, MD, RPVI [facility] Electronically Signed   By: Malachy Moan M.D.   On: 10/20/2018 08:47   Ct Head Code Stroke Wo Contrast  Result Date: 10/19/2018 CLINICAL DATA:  Code stroke. Initial evaluation for acute  left-sided weakness. EXAM: CT HEAD WITHOUT CONTRAST TECHNIQUE: Contiguous axial images were obtained from the base of the skull through the vertex without intravenous contrast. COMPARISON:  Prior CT from 06/23/2013. FINDINGS: Brain: Cerebral volume within normal limits for patient age. No evidence for acute intracranial hemorrhage. No findings to suggest acute large vessel territory infarct. No mass lesion, midline shift, or mass effect. Ventricles are normal in size without evidence for hydrocephalus. No extra-axial fluid collection identified. Chiari 1 malformation noted. Vascular: No hyperdense vessel identified. Skull: Scalp soft tissues demonstrate no acute abnormality. Calvarium intact. Sinuses/Orbits: Globes and orbital soft tissues within normal limits. Visualized paranasal sinuses are clear. Chronic bilateral nasal bone fractures noted. No mastoid effusion. ASPECTS [facility] [address]([place] Stroke Program Early CT Score) - Ganglionic level infarction (caudate, lentiform nuclei, internal capsule, insula, M1-M3 cortex): 7 - Supraganglionic infarction (M4-M6 cortex): 3 Total score (0-10 with 10 being normal): 10 IMPRESSION: 1. No acute intracranial infarct or other abnormality identified. 2. ASPECTS is 10. 3. Chiari 1 malformation. Critical Value/emergent results were called by telephone at the time of interpretation on 10/19/2018 at 9:41 pm to Dr. Vanetta MuldersSCOTT ZACKOWSKI , who verbally acknowledged these results. Electronically Signed   By: Rise MuBenjamin  McClintock M.D.   On: 10/19/2018 21:45     Microbiology: Recent Results (from the past 240 hour(s))  SARS Coronavirus 2 (CEPHEID - Performed in [facility] lab), Hosp Order     Status: None   Collection Time: 10/19/18 10:05 PM   Specimen: Nasopharyngeal Swab  Result Value Ref Range Status   SARS Coronavirus 2 NEGATIVE NEGATIVE Final    Comment: (NOTE) If result is NEGATIVE SARS-CoV-2 target nucleic acids are NOT DETECTED. The SARS-CoV-2 RNA is generally detectable  in upper and lower  respiratory specimens during the acute phase of infection. The lowest  concentration of SARS-CoV-2 viral copies this assay can detect is 250  copies / mL. A negative result does not preclude SARS-CoV-2 infection  and should not be used as the sole basis for treatment or other  patient management decisions.  A negative result may occur with  improper specimen collection / handling, submission of specimen other  than nasopharyngeal swab, presence of viral mutation(s) within the  areas targeted by this assay, and inadequate number of viral copies  (<250 copies / mL). A negative result must be combined with clinical  observations, patient history, and epidemiological information. If result is POSITIVE SARS-CoV-2 target nucleic acids are DETECTED. The SARS-CoV-2 RNA is generally detectable in upper and lower  respiratory specimens dur ing the acute phase of infection.  Positive  results are indicative of active infection with SARS-CoV-2.  Clinical  correlation with patient history and other diagnostic information is  necessary to determine patient infection status.  Positive results do  not rule out bacterial infection or co-infection with other viruses. If result is PRESUMPTIVE POSTIVE SARS-CoV-2 nucleic acids MAY BE PRESENT.   A presumptive positive result was obtained on the submitted specimen  and confirmed on repeat testing.  While 2019 novel coronavirus  (SARS-CoV-2) nucleic acids may be present in the submitted sample  additional confirmatory testing may be necessary for epidemiological  and / or clinical management purposes  to differentiate between  SARS-CoV-2 and other Sarbecovirus currently known to infect humans.  If clinically indicated additional testing with an alternate test  methodology [phone] is advised. The SARS-CoV-2 RNA is generally  detectable in upper and lower respiratory  sp ecimens during the acute  phase of infection. The expected result is  Negative. Fact Sheet for Patients:  BoilerBrush.com.cyhttps://www.fda.gov/media/136312/download Fact Sheet for Healthcare Providers: https://pope.com/https://www.fda.gov/media/136313/download This test is not yet approved or cleared by the [place] FDA and has been authorized for detection and/or diagnosis of SARS-CoV-2 by FDA under an Emergency Use Authorization (EUA).  This EUA will remain in effect (meaning this test can be used) for the duration of the COVID-19 declaration under Section 564(b)(1) of the Act, 21 U.S.C. section 360bbb-3(b)(1), unless the authorization is terminated or revoked sooner. Performed at [facility], [address] [place], [place] [address]      Labs: Basic Metabolic Panel: Recent Labs  Lab 10/19/18 2126 10/19/18 2128  NA 137 140  K 3.4* 3.5  CL 105 105  CO2 23  --   GLUCOSE 97 93  BUN 7 5*  CREATININE 0.72 0.70  CALCIUM 9.2  --    Liver Function Tests: Recent Labs  Lab 10/19/18 2126  AST 15  ALT 15  ALKPHOS 43  BILITOT 0.8  PROT 7.9  ALBUMIN 4.7   No results for input(s): LIPASE, AMYLASE in the last 168 hours. No results for input(s): AMMONIA in the last 168 hours. CBC: Recent Labs  Lab 10/19/18 2126 10/19/18 2128  WBC 13.7*  --   NEUTROABS 8.4*  --   HGB 11.6* 12.9  HCT 36.7 38.0  MCV 91.1  --   PLT 270  --    Cardiac Enzymes: No results for input(s): CKTOTAL, CKMB, CKMBINDEX, TROPONINI in the last 168 hours. BNP: Invalid input(s): POCBNP CBG: Recent Labs  Lab 10/19/18 2134  GLUCAP 90    Time coordinating discharge:  36 minutes  Signed:  Catarina Hartshornavid Pier Laux, DO [facility] Pager: [phone] 10/21/2018, 7:43 AM

## 2018-11-09 ENCOUNTER — Encounter: Payer: Self-pay | Admitting: Medical

## 2018-11-09 ENCOUNTER — Other Ambulatory Visit: Payer: Managed Care, Other (non HMO) | Admitting: Adult Health

## 2018-11-15 ENCOUNTER — Telehealth: Payer: Self-pay | Admitting: Medical

## 2018-11-15 NOTE — Telephone Encounter (Signed)

## 2018-11-16 ENCOUNTER — Ambulatory Visit: Payer: Managed Care, Other (non HMO) | Admitting: Medical

## 2018-11-16 ENCOUNTER — Other Ambulatory Visit: Payer: Self-pay

## 2018-11-16 NOTE — Patient Instructions (Addendum)
[place] Pediatricians/Family Doctors:             [employer] [phone]                 [place] [phone] (usually not accepting new patients unless you have family there already, you are always welcome to call and ask)      [facility]: [phone]   Dr. Tula Nakayama  [phone]   [place] [phone]       [facility] Pediatricians/Family Doctors:   [place]: [place]: [place] Doctors:   [facility]: [facility]: [place]:  [place]: [phone]

## 2018-11-21 NOTE — Progress Notes (Signed)
This appointment was cancelled. I did not see the patient.   Luvenia Redden, PA-C 11/21/2018 9:47 AM

## 2018-12-19 ENCOUNTER — Telehealth: Payer: Self-pay | Admitting: Women's Health

## 2018-12-19 NOTE — Telephone Encounter (Signed)
Tried to reach the patient to remind her of her appointment/restrictions, mailbox not setup. °

## 2018-12-20 ENCOUNTER — Other Ambulatory Visit: Payer: Managed Care, Other (non HMO) | Admitting: Women's Health
